# Patient Record
Sex: Male | Born: 1975 | Race: White | Hispanic: No | Marital: Married | Smoking: Current every day smoker
Health system: Southern US, Community
[De-identification: ages and names within clinical notes are randomized; demographics above are authoritative.]

## PROBLEM LIST (undated history)

## (undated) DIAGNOSIS — M1990 Unspecified osteoarthritis, unspecified site: Secondary | ICD-10-CM

## (undated) DIAGNOSIS — M069 Rheumatoid arthritis, unspecified: Secondary | ICD-10-CM

## (undated) DIAGNOSIS — N200 Calculus of kidney: Secondary | ICD-10-CM

## (undated) DIAGNOSIS — E669 Obesity, unspecified: Secondary | ICD-10-CM

## (undated) DIAGNOSIS — K219 Gastro-esophageal reflux disease without esophagitis: Secondary | ICD-10-CM

## (undated) HISTORY — DX: Obesity, unspecified: E66.9

## (undated) HISTORY — PX: HERNIA REPAIR: SHX51

## (undated) HISTORY — PX: CHOLECYSTECTOMY: SHX55

## (undated) HISTORY — DX: Rheumatoid arthritis, unspecified: M06.9

---

## 2003-05-06 ENCOUNTER — Emergency Department (HOSPITAL_COMMUNITY): Admission: EM | Admit: 2003-05-06 | Discharge: 2003-05-06 | Payer: Self-pay | Admitting: Emergency Medicine

## 2003-05-06 ENCOUNTER — Encounter: Payer: Self-pay | Admitting: Emergency Medicine

## 2007-06-06 ENCOUNTER — Emergency Department (HOSPITAL_COMMUNITY): Admission: EM | Admit: 2007-06-06 | Discharge: 2007-06-06 | Payer: Self-pay | Admitting: Emergency Medicine

## 2012-12-02 ENCOUNTER — Emergency Department (HOSPITAL_COMMUNITY)
Admission: EM | Admit: 2012-12-02 | Discharge: 2012-12-02 | Disposition: A | Discharge status: Home / Self Care | Payer: Self-pay | Attending: Emergency Medicine | Admitting: Emergency Medicine

## 2012-12-02 ENCOUNTER — Encounter (HOSPITAL_COMMUNITY): Payer: Self-pay | Admitting: *Deleted

## 2012-12-02 ENCOUNTER — Emergency Department (HOSPITAL_COMMUNITY): Payer: Self-pay

## 2012-12-02 DIAGNOSIS — F17200 Nicotine dependence, unspecified, uncomplicated: Secondary | ICD-10-CM | POA: Insufficient documentation

## 2012-12-02 DIAGNOSIS — R6883 Chills (without fever): Secondary | ICD-10-CM | POA: Insufficient documentation

## 2012-12-02 DIAGNOSIS — J36 Peritonsillar abscess: Secondary | ICD-10-CM | POA: Insufficient documentation

## 2012-12-02 DIAGNOSIS — R112 Nausea with vomiting, unspecified: Secondary | ICD-10-CM | POA: Insufficient documentation

## 2012-12-02 DIAGNOSIS — R070 Pain in throat: Secondary | ICD-10-CM | POA: Insufficient documentation

## 2012-12-02 LAB — POCT I-STAT, CHEM 8
Calcium, Ion: 1.18 mmol/L (ref 1.12–1.23)
Creatinine, Ser: 0.8 mg/dL (ref 0.50–1.35)
Glucose, Bld: 138 mg/dL — ABNORMAL HIGH (ref 70–99)
HCT: 49 % (ref 39.0–52.0)
Hemoglobin: 16.7 g/dL (ref 13.0–17.0)
Potassium: 3.8 mEq/L (ref 3.5–5.1)
TCO2: 28 mmol/L (ref 0–100)

## 2012-12-02 LAB — RAPID STREP SCREEN (MED CTR MEBANE ONLY): Streptococcus, Group A Screen (Direct): NEGATIVE

## 2012-12-02 MED ORDER — ONDANSETRON 4 MG PO TBDP: 4.0000 mg | ORAL_TABLET | Freq: Three times a day (TID) | ORAL | Status: DC | PRN

## 2012-12-02 MED ORDER — CLINDAMYCIN HCL 150 MG PO CAPS: ORAL_CAPSULE | ORAL | Status: DC

## 2012-12-02 MED ORDER — SODIUM CHLORIDE 0.9 % IV BOLUS (SEPSIS)
500.0000 mL | Freq: Once | INTRAVENOUS | Status: AC
  Administered 2012-12-02: 500 mL via INTRAVENOUS

## 2012-12-02 MED ORDER — SODIUM CHLORIDE 0.9 % IV SOLN
INTRAVENOUS | Status: DC
  Administered 2012-12-02: 19:00:00 via INTRAVENOUS

## 2012-12-02 MED ORDER — HYDROCODONE-ACETAMINOPHEN 7.5-500 MG/15ML PO SOLN: 15.0000 mL | Freq: Four times a day (QID) | ORAL | Status: DC | PRN

## 2012-12-02 MED ORDER — HYDROMORPHONE HCL PF 1 MG/ML IJ SOLN
1.0000 mg | Freq: Once | INTRAMUSCULAR | Status: AC
  Administered 2012-12-02: 1 mg via INTRAVENOUS
  Filled 2012-12-02: qty 1

## 2012-12-02 MED ORDER — IOHEXOL 300 MG/ML  SOLN
75.0000 mL | Freq: Once | INTRAMUSCULAR | Status: AC | PRN
  Administered 2012-12-02: 75 mL via INTRAVENOUS

## 2012-12-02 MED ORDER — MORPHINE SULFATE 4 MG/ML IJ SOLN
4.0000 mg | INTRAMUSCULAR | Status: DC | PRN
  Administered 2012-12-02: 4 mg via INTRAVENOUS
  Filled 2012-12-02: qty 1

## 2012-12-02 MED ORDER — ONDANSETRON HCL 4 MG/2ML IJ SOLN
4.0000 mg | INTRAMUSCULAR | Status: DC | PRN
  Administered 2012-12-02: 4 mg via INTRAVENOUS
  Filled 2012-12-02: qty 2

## 2012-12-02 MED ORDER — CLINDAMYCIN PHOSPHATE 600 MG/50ML IV SOLN
600.0000 mg | Freq: Once | INTRAVENOUS | Status: AC
  Administered 2012-12-02: 600 mg via INTRAVENOUS
  Filled 2012-12-02: qty 50

## 2012-12-02 MED ORDER — DEXAMETHASONE SODIUM PHOSPHATE 4 MG/ML IJ SOLN
10.0000 mg | Freq: Once | INTRAMUSCULAR | Status: AC
  Administered 2012-12-02: 10 mg via INTRAVENOUS
  Filled 2012-12-02: qty 3

## 2012-12-02 MED ORDER — SODIUM CHLORIDE 0.9 % IV BOLUS (SEPSIS)
1000.0000 mL | Freq: Once | INTRAVENOUS | Status: AC
  Administered 2012-12-02: 1000 mL via INTRAVENOUS

## 2012-12-02 NOTE — ED Notes (Signed)
Tolerating po fluids - drank approx 4 oz of Sprite.  Pt does c/o pain with swallowing.

## 2012-12-02 NOTE — ED Provider Notes (Signed)
History     CSN: [id number]  Arrival date & time 12/02/12  1551   First MD Initiated Contact with Patient 12/02/12 1610      Chief Complaint  Patient presents with  . Sore Throat     HPI Pt was seen at 1625.   Per pt, c/o gradual onset and persistence of constant sore throat and "chills" for the past 5 days.  Has been associated with several episodes of N/V for the past 2 days. States the right side of his throat "hurts more" than the left side. Denies fevers, no rash, no CP/SOB, no diarrhea, no abd pain.     History reviewed. No pertinent past medical history.  Past Surgical History  Procedure Laterality Date  . Hernia repair       History  Substance Use Topics  . Smoking status: Current Every Day Smoker  . Smokeless tobacco: Not on file  . Alcohol Use: No      Review of Systems ROS: Statement: All systems negative except as marked or noted in the HPI; Constitutional: Negative for fever and +chills. ; ; Eyes: Negative for eye pain, redness and discharge. ; ; ENMT: Negative for ear pain, hoarseness, nasal congestion, sinus pressure and +sore throat. ; ; Cardiovascular: Negative for chest pain, palpitations, diaphoresis, dyspnea and peripheral edema. ; ; Respiratory: Negative for cough, wheezing and stridor. ; ; Gastrointestinal: +N/V. Negative for diarrhea, abdominal pain, blood in stool, hematemesis, jaundice and rectal bleeding. . ; ; Genitourinary: Negative for dysuria, flank pain and hematuria. ; ; Musculoskeletal: Negative for back pain and neck pain. Negative for swelling and trauma.; ; Skin: Negative for pruritus, rash, abrasions, blisters, bruising and skin lesion.; ; Neuro: Negative for headache, lightheadedness and neck stiffness. Negative for weakness, altered level of consciousness , altered mental status, extremity weakness, paresthesias, involuntary movement, seizure and syncope.       Allergies  Review of patient's allergies indicates no known  allergies.  Home Medications   Current Outpatient Rx  Name  Route  Sig  Dispense  Refill  . acetaminophen (TYLENOL) 500 MG tablet   Oral   Take 500 mg by mouth every 6 (six) hours as needed for pain.         Marland Kitchen Phenylephrine-DM-GG-APAP (TYLENOL COLD/FLU SEVERE) 5-10-200-325 MG TABS   Oral   Take 2 tablets by mouth daily as needed (for pain and cold symptoms).           BP 106/82  Pulse 84  Temp(Src) 98.8 F (37.1 C) (Oral)  Resp 18  Ht 5\' 8"  (1.727 m)  Wt 230 lb (104.327 kg)  BMI 34.98 kg/m2  SpO2 95%  Physical Exam 1630: Physical examination:  Nursing notes reviewed; Vital signs and O2 SAT reviewed;  Constitutional: Well developed, Well nourished, Well hydrated, In no acute distress; Head:  Normocephalic, atraumatic; Eyes: EOMI, PERRL, No scleral icterus; ENMT: TM's clear bilat. +edemetous nasal turbinates bilat with clear rhinorrhea. Mouth and pharynx without lesions. +posterior pharyngeal erythema with right soft palate bulging. No tonsillar exudates. No submandibular or sublingual edema. No hoarse voice, no drooling, no stridor. No pain with manipulation of larynx. Mucous membranes moist; Neck: Supple, Full range of motion, No lymphadenopathy; Cardiovascular: Regular rate and rhythm, No murmur, rub, or gallop; Respiratory: Breath sounds clear & equal bilaterally, No rales, rhonchi, wheezes.  Speaking full sentences with ease, Normal respiratory effort/excursion; Chest: Nontender, Movement normal; Abdomen: Soft, Nontender, Nondistended, Normal bowel sounds;; Extremities: Pulses normal, No tenderness, No edema, No  calf edema or asymmetry.; Neuro: AA&Ox3, Major CN grossly intact.  Speech clear. No gross focal motor or sensory deficits in extremities.; Skin: Color normal, Warm, Dry.   ED Course  Procedures     MDM  MDM Reviewed: previous chart, nursing note and vitals Interpretation: labs and CT scan   Results for orders placed during the hospital encounter of 12/02/12   RAPID STREP SCREEN      Result Value Range   Streptococcus, Group A Screen (Direct) NEGATIVE  NEGATIVE  POCT I-STAT, CHEM 8      Result Value Range   Sodium 137  135 - 145 mEq/L   Potassium 3.8  3.5 - 5.1 mEq/L   Chloride 101  96 - 112 mEq/L   BUN 22  6 - 23 mg/dL   Creatinine, Ser 1.61  0.50 - 1.35 mg/dL   Glucose, Bld 096 (*) 70 - 99 mg/dL   Calcium, Ion 0.45  4.09 - 1.23 mmol/L   TCO2 28  0 - 100 mmol/L   Hemoglobin 16.7  13.0 - 17.0 g/dL   HCT 81.1  91.4 - 78.2 %   Ct Soft Tissue Neck W Contrast 12/02/2012  *RADIOLOGY REPORT*  Clinical Data: Sore throat, pain  CT NECK WITH CONTRAST  Technique:  Multidetector CT imaging of the neck was performed with intravenous contrast.  Contrast: 75mL OMNIPAQUE IOHEXOL 300 MG/ML  SOLN  Comparison: None.  Findings: 1.8 x 2.5 x 2.1 cm right peritonsillar abscess (series 2/image 32).  The airway is narrowed but remains patent.  Associated bilateral cervical lymph nodes measuring up to 12 mm short axis on the right and 9 mm short axis on the left, likely reactive.  Mucosal thickening/partial opacification of the left maxillary sinus.  The mastoid air cells are clear.  Thyroid is within normal limits.  The cervical spine is normal.  The visualized posterior fossa is unremarkable.  IMPRESSION: 2.5 cm right peritonsillar abscess.   Original Report Authenticated By: Charline Bills, M.D.      Kinsey.Precise:  Dx and testing d/w pt.  Questions answered.  Verb understanding.  States he "doesn't want to go to [facility] by ambulance" and would rather see an ENT MD in the office.  T/C to ENT Dr Annalee Genta, case discussed, including:  HPI, pertinent PM/SHx, VS/PE, dx testing, ED course and treatment:  requests to dose IV decadron and clindamycin here in the ED, make sure pt is well hydrated with 2L NS IV, and have pt call the office tomorrow morning at 9am to be seen in the office tomorrow.  Pt informed of conversation and is agreeable with plan.  States he is hungry and wants to eat.   Will trial PO.  2010:  Pt has tol PO well while in the ED without N/V or gagging.  Pt states he feels "a little better" and wants to go home now.  Pt agrees he will f/u with ENT MD tomorrow in his office.  Will tx pain meds, antiemetic and abx.            Laray Anger, DO 12/04/12 2107

## 2012-12-02 NOTE — ED Notes (Signed)
Pt given Sprite 8oz for po fluid challenge.

## 2012-12-02 NOTE — ED Notes (Addendum)
Pt c/o sore throat since Thursday, ?fever, n/v for the past two days,

## 2014-05-19 ENCOUNTER — Encounter (HOSPITAL_COMMUNITY): Payer: Self-pay | Admitting: Emergency Medicine

## 2014-05-19 ENCOUNTER — Emergency Department (HOSPITAL_COMMUNITY)
Admission: EM | Admit: 2014-05-19 | Discharge: 2014-05-20 | Disposition: A | Discharge status: Home / Self Care | Payer: Worker's Compensation | Attending: Emergency Medicine | Admitting: Emergency Medicine

## 2014-05-19 ENCOUNTER — Emergency Department (HOSPITAL_COMMUNITY): Payer: Worker's Compensation

## 2014-05-19 DIAGNOSIS — S0180XA Unspecified open wound of other part of head, initial encounter: Secondary | ICD-10-CM | POA: Insufficient documentation

## 2014-05-19 DIAGNOSIS — Y9289 Other specified places as the place of occurrence of the external cause: Secondary | ICD-10-CM | POA: Insufficient documentation

## 2014-05-19 DIAGNOSIS — F17200 Nicotine dependence, unspecified, uncomplicated: Secondary | ICD-10-CM | POA: Diagnosis not present

## 2014-05-19 DIAGNOSIS — Y9389 Activity, other specified: Secondary | ICD-10-CM | POA: Diagnosis not present

## 2014-05-19 DIAGNOSIS — S0230XB Fracture of orbital floor, unspecified side, initial encounter for open fracture: Secondary | ICD-10-CM | POA: Diagnosis not present

## 2014-05-19 DIAGNOSIS — IMO0002 Reserved for concepts with insufficient information to code with codable children: Secondary | ICD-10-CM | POA: Insufficient documentation

## 2014-05-19 DIAGNOSIS — Z23 Encounter for immunization: Secondary | ICD-10-CM | POA: Insufficient documentation

## 2014-05-19 DIAGNOSIS — S0990XA Unspecified injury of head, initial encounter: Secondary | ICD-10-CM | POA: Diagnosis present

## 2014-05-19 DIAGNOSIS — S0285XB Fracture of orbit, unspecified, initial encounter for open fracture: Secondary | ICD-10-CM

## 2014-05-19 MED ORDER — CEFAZOLIN SODIUM 1-5 GM-% IV SOLN
1.0000 g | Freq: Once | INTRAVENOUS | Status: AC
  Administered 2014-05-19: 1 g via INTRAVENOUS
  Filled 2014-05-19: qty 50

## 2014-05-19 MED ORDER — LIDOCAINE-EPINEPHRINE 1 %-1:100000 IJ SOLN
20.0000 mL | Freq: Once | INTRAMUSCULAR | Status: DC
  Filled 2014-05-19: qty 1

## 2014-05-19 MED ORDER — TETANUS-DIPHTHERIA TOXOIDS TD 5-2 LFU IM INJ
0.5000 mL | INJECTION | Freq: Once | INTRAMUSCULAR | Status: AC
  Administered 2014-05-19: 0.5 mL via INTRAMUSCULAR
  Filled 2014-05-19: qty 0.5

## 2014-05-19 MED ORDER — LIDOCAINE-EPINEPHRINE 2 %-1:100000 IJ SOLN: 20.0000 mL | Freq: Once | INTRAMUSCULAR | Status: DC

## 2014-05-19 NOTE — ED Notes (Addendum)
Per ems: working on [employer] parts and was struck in head with airbrake chamber and the caliper spring released and struck patient in head, no loc, but momentary disorientation.  GCS 15 throughout ems ride.  Spine and back cleared at scene, no pain in either of those areas. Bleeding controlled, laceration to right brow, 2 cm.

## 2014-05-19 NOTE — Progress Notes (Signed)
Orthopedic Tech Progress Note Patient Details:  BAIRD POLINSKI [birth year] [id number]  Patient ID: Petra Kuba, male   DOB: 21-Aug-1976, 38 y.o.   MRN: [id number] Made level 2 trauma visit  Hildred Priest 05/19/2014, 10:06 PM

## 2014-05-20 MED ORDER — CEPHALEXIN 500 MG PO CAPS: 500.0000 mg | ORAL_CAPSULE | Freq: Four times a day (QID) | ORAL | Status: DC

## 2014-05-20 MED ORDER — OXYCODONE-ACETAMINOPHEN 5-325 MG PO TABS
2.0000 | ORAL_TABLET | Freq: Once | ORAL | Status: AC
  Administered 2014-05-20: 2 via ORAL
  Filled 2014-05-20: qty 2

## 2014-05-20 MED ORDER — ONDANSETRON HCL 4 MG/2ML IJ SOLN
4.0000 mg | Freq: Once | INTRAMUSCULAR | Status: AC
  Administered 2014-05-20: 4 mg via INTRAVENOUS
  Filled 2014-05-20: qty 2

## 2014-05-20 MED ORDER — OXYCODONE-ACETAMINOPHEN 5-325 MG PO TABS: 2.0000 | ORAL_TABLET | ORAL | Status: DC | PRN

## 2014-05-20 NOTE — ED Provider Notes (Signed)
CSN: [id number]     Arrival date & time 05/19/14  2159 History   First MD Initiated Contact with Patient 05/19/14 2201     Chief Complaint  Patient presents with  . Head Injury     HPI  Patient presents after an injury at work. He works on [employer]. He was working on a hydraulic break. There is a spring device that is under 3000 pound PSI pressure. A fragment broke off of this. It struck him either directly or irritation a fracture. He has pain of his right eye the laceration. He states that it took him a few seconds or less what was going on but he had no loss of consciousness. No additional injury.  History reviewed. No pertinent past medical history. Past Surgical History  Procedure Laterality Date  . Hernia repair     No family history on file. History  Substance Use Topics  . Smoking status: Current Every Day Smoker -- 0.00 packs/day  . Smokeless tobacco: Not on file  . Alcohol Use: No    Review of Systems  Constitutional: Negative for fever, chills, diaphoresis, appetite change and fatigue.  HENT: Negative for mouth sores, sore throat and trouble swallowing.   Eyes: Negative for visual disturbance.  Respiratory: Negative for cough, chest tightness, shortness of breath and wheezing.   Cardiovascular: Negative for chest pain.  Gastrointestinal: Negative for nausea, vomiting, abdominal pain, diarrhea and abdominal distention.  Endocrine: Negative for polydipsia, polyphagia and polyuria.  Genitourinary: Negative for dysuria, frequency and hematuria.  Musculoskeletal: Negative for gait problem.  Skin: Positive for wound. Negative for color change, pallor and rash.  Neurological: Positive for headaches. Negative for dizziness, syncope and light-headedness.  Hematological: Does not bruise/bleed easily.  Psychiatric/Behavioral: Negative for behavioral problems and confusion.      Allergies  Morphine and related  Home Medications   Prior to Admission medications     Medication Sig Start Date End Date Taking? Authorizing Provider  diphenhydrAMINE (BENADRYL) 25 MG tablet Take 25 mg by mouth every 6 (six) hours as needed for itching or allergies.    Yes Historical Provider, MD  cephALEXin (KEFLEX) 500 MG capsule Take 1 capsule (500 mg total) by mouth 4 (four) times daily. 05/20/14   Tanna Furry, MD  oxyCODONE-acetaminophen (PERCOCET/ROXICET) 5-325 MG per tablet Take 2 tablets by mouth every 4 (four) hours as needed. 05/20/14   Tanna Furry, MD   BP 123/73  Pulse 72  Temp(Src) 98.6 F (37 C) (Oral)  Resp 18  Ht 5\' 7"  (1.702 m)  Wt 240 lb (108.863 kg)  BMI 37.58 kg/m2  SpO2 93% Physical Exam  HENT:  Head:    Eyes:      ED Course  LACERATION REPAIR Date/Time: 05/20/2014 12:27 AM Performed by: Tanna Furry Authorized by: Tanna Furry Consent: Verbal consent obtained. written consent obtained. Risks and benefits: risks, benefits and alternatives were discussed Consent given by: patient Patient understanding: patient states understanding of the procedure being performed Patient identity confirmed: verbally with patient Body area: head/neck Location details: right eyebrow Laceration length: 3 cm Foreign bodies: no foreign bodies Tendon involvement: none Nerve involvement: none Vascular damage: no Anesthesia: local infiltration Local anesthetic: lidocaine 2% with epinephrine Anesthetic total: 5 ml Irrigation solution: saline Irrigation method: syringe Amount of cleaning: standard Debridement: none Degree of undermining: none Skin closure: 5-0 nylon Subcutaneous closure: 5-0 Vicryl Fascia closure: 5-0 Vicryl Number of sutures: 20 Technique: simple and running Approximation: close Approximation difficulty: complex Dressing: 4x4 sterile gauze (Ace  wrap)   (including critical care time) Labs Review Labs Reviewed - No data to display  Imaging Review Ct Head Wo Contrast  05/19/2014   CLINICAL DATA:  Head trauma, confusion  EXAM: CT HEAD  WITHOUT CONTRAST  TECHNIQUE: Contiguous axial images were obtained from the base of the skull through the vertex without intravenous contrast.  COMPARISON:  Neck CT 12/02/2012  FINDINGS: Cavum vergae incidentally noted. No acute hemorrhage, infarct, or mass lesion is identified. No midline shift. Ventricles are normal in size. Orbits and paranasal sinuses are unremarkable. No skull fracture. Earrings produce streak artifact. Chronic left maxillary sinusitis noted. Partial opacification of the ethmoid sinuses is noted. There is a mildly displaced fracture of the superior right orbital wall with overlying scalp laceration and mild stranding within the adjacent intraconal fat. The globes are grossly unremarkable.  IMPRESSION: Mildly depressed superior right orbital wall fracture with overlying skin laceration. Mild adjacent stranding within the intraconal fat.  Chronic left maxillary sinusitis.  No acute intra cranial finding.   Electronically Signed   By: Conchita Paris M.D.   On: 05/19/2014 22:54     EKG Interpretation None      MDM   Final diagnoses:  Orbital fracture, open, initial encounter    CT scan results noted. Shows an indentation of the supraorbital ridge and a fracture of his lateral right orbital rub. This fragment is displaced posteriorly slightly inferiorly. There is some minimal adjacent orbital at the same. However no retro-orbital hematoma noted. Repeat exam his vision is normal as I moves normally. No abnormality noted to the conjunctiva the bulbar or palpebral.  Care discussed with Dr. Katy Apo, ophthalmology. He felt this no globe abnormalities or vision abnormalities wound could be closed and he was follow the patient tomorrow morning in his office.  Wound was anesthetized and copiously irrigated. Upon exploration there is visualized fracture at the supraorbital rim. Not able to be removed to the laceration. It was then closed in 3 layer fashion as above. Repeat exam shows  no conjunctival abnormalities continued normal vision and extraocular movements.    Tanna Furry, MD 05/20/14 [phone]

## 2014-05-20 NOTE — Progress Notes (Signed)
Chaplain responded to level 2 trauma page for pt in "tractor trailer accident." Chaplain visited briefly with pt after medical staff gave me the green light to do so. Pt awake and alert but with nasty laceration above his right eye. Pt states he was working on the truck (in a [employer]) when part of a spring flew off and struck him in the head. I offered to make a call for him but he had a cell phone and wanted to call his wife himself.

## 2014-05-20 NOTE — Discharge Instructions (Signed)
Recheck tomorrow morning at Dr. Prudencio Burly office at 10:00am.  Facial Fracture A facial fracture is a break in one of the bones of your face. HOME CARE INSTRUCTIONS   Protect the injured part of your face until it is healed.  Do not participate in activities which give chance for re-injury until your doctor approves.  Gently wash and dry your face.  Wear head and facial protection while riding a bicycle, motorcycle, or snowmobile. SEEK MEDICAL CARE IF:   An oral temperature above 102 F (38.9 C) develops.  You have severe headaches or notice changes in your vision.  You have new numbness or tingling in your face.  You develop nausea (feeling sick to your stomach), vomiting or a stiff neck. SEEK IMMEDIATE MEDICAL CARE IF:   You develop difficulty seeing or experience double vision.  You become dizzy, lightheaded, or faint.  You develop trouble speaking, breathing, or swallowing.  You have a watery discharge from your nose or ear. MAKE SURE YOU:   Understand these instructions.  Will watch your condition.  Will get help right away if you are not doing well or get worse. Document Released: 08/20/2005 Document Revised: 11/12/2011 Document Reviewed: 04/08/2008 [facility] Patient Information 2015 [place], [place]. This information is not intended to replace advice given to you by your health care provider. Make sure you discuss any questions you have with your health care provider.

## 2016-02-13 ENCOUNTER — Emergency Department (HOSPITAL_COMMUNITY): Payer: Self-pay

## 2016-02-13 ENCOUNTER — Encounter (HOSPITAL_COMMUNITY): Payer: Self-pay | Admitting: Emergency Medicine

## 2016-02-13 ENCOUNTER — Emergency Department (HOSPITAL_COMMUNITY)
Admission: EM | Admit: 2016-02-13 | Discharge: 2016-02-13 | Disposition: A | Discharge status: Home / Self Care | Payer: Self-pay | Attending: Emergency Medicine | Admitting: Emergency Medicine

## 2016-02-13 DIAGNOSIS — K8050 Calculus of bile duct without cholangitis or cholecystitis without obstruction: Secondary | ICD-10-CM | POA: Insufficient documentation

## 2016-02-13 DIAGNOSIS — F17210 Nicotine dependence, cigarettes, uncomplicated: Secondary | ICD-10-CM | POA: Insufficient documentation

## 2016-02-13 DIAGNOSIS — R079 Chest pain, unspecified: Secondary | ICD-10-CM | POA: Insufficient documentation

## 2016-02-13 DIAGNOSIS — R1013 Epigastric pain: Secondary | ICD-10-CM

## 2016-02-13 LAB — URINALYSIS, ROUTINE W REFLEX MICROSCOPIC
BILIRUBIN URINE: NEGATIVE
GLUCOSE, UA: NEGATIVE mg/dL
HGB URINE DIPSTICK: NEGATIVE
Ketones, ur: NEGATIVE mg/dL
Leukocytes, UA: NEGATIVE
Nitrite: NEGATIVE
Protein, ur: NEGATIVE mg/dL
SPECIFIC GRAVITY, URINE: 1.02 (ref 1.005–1.030)
pH: 7 (ref 5.0–8.0)

## 2016-02-13 LAB — BASIC METABOLIC PANEL
Anion gap: 6 (ref 5–15)
BUN: 20 mg/dL (ref 6–20)
CO2: 27 mmol/L (ref 22–32)
Calcium: 9.2 mg/dL (ref 8.9–10.3)
Chloride: 104 mmol/L (ref 101–111)
Creatinine, Ser: 0.79 mg/dL (ref 0.61–1.24)
GFR calc Af Amer: >60 mL/min (ref >60)
GFR calc non Af Amer: >60 mL/min (ref >60)
GLUCOSE: 142 mg/dL — AB (ref 65–99)
Potassium: 3.5 mmol/L (ref 3.5–5.1)
SODIUM: 137 mmol/L (ref 135–145)

## 2016-02-13 LAB — HEPATIC FUNCTION PANEL
ALBUMIN: 4.2 g/dL (ref 3.5–5.0)
ALK PHOS: 95 U/L (ref 38–126)
ALT: 23 U/L (ref 17–63)
AST: 19 U/L (ref 15–41)
BILIRUBIN TOTAL: 0.4 mg/dL (ref 0.3–1.2)
Bilirubin, Direct: <0.1 mg/dL — ABNORMAL LOW (ref 0.1–0.5)
Total Protein: 7.5 g/dL (ref 6.5–8.1)

## 2016-02-13 LAB — CBC
HEMATOCRIT: 43.6 % (ref 39.0–52.0)
HEMOGLOBIN: 14.9 g/dL (ref 13.0–17.0)
MCH: 32.3 pg (ref 26.0–34.0)
MCHC: 34.2 g/dL (ref 30.0–36.0)
MCV: 94.6 fL (ref 78.0–100.0)
PLATELETS: 297 10*3/uL (ref 150–400)
RBC: 4.61 MIL/uL (ref 4.22–5.81)
RDW: 13 % (ref 11.5–15.5)
WBC: 10.5 10*3/uL (ref 4.0–10.5)

## 2016-02-13 LAB — TROPONIN I: Troponin I: <0.03 ng/mL (ref <0.031)

## 2016-02-13 LAB — LIPASE, BLOOD: Lipase: 15 U/L (ref 11–51)

## 2016-02-13 MED ORDER — GI COCKTAIL ~~LOC~~
30.0000 mL | Freq: Once | ORAL | Status: AC
  Administered 2016-02-13: 30 mL via ORAL
  Filled 2016-02-13: qty 30

## 2016-02-13 MED ORDER — ASPIRIN 81 MG PO CHEW
324.0000 mg | CHEWABLE_TABLET | Freq: Once | ORAL | Status: AC
  Administered 2016-02-13: 324 mg via ORAL
  Filled 2016-02-13: qty 4

## 2016-02-13 MED ORDER — OMEPRAZOLE 20 MG PO CPDR: 20.0000 mg | DELAYED_RELEASE_CAPSULE | Freq: Every day | ORAL | Status: DC

## 2016-02-13 MED ORDER — FAMOTIDINE IN NACL 20-0.9 MG/50ML-% IV SOLN
20.0000 mg | Freq: Once | INTRAVENOUS | Status: AC
  Administered 2016-02-13: 20 mg via INTRAVENOUS
  Filled 2016-02-13: qty 50

## 2016-02-13 MED ORDER — FENTANYL CITRATE (PF) 100 MCG/2ML IJ SOLN
50.0000 ug | INTRAMUSCULAR | Status: DC | PRN
  Administered 2016-02-13 (×2): 50 ug via INTRAVENOUS
  Filled 2016-02-13 (×2): qty 2

## 2016-02-13 MED ORDER — ASPIRIN 81 MG PO CHEW
CHEWABLE_TABLET | ORAL | Status: AC
  Filled 2016-02-13: qty 1

## 2016-02-13 NOTE — ED Provider Notes (Signed)
  Physical Exam  BP 132/81 mmHg  Pulse 43  Temp(Src) 97.7 F (36.5 C) (Oral)  Resp 20  Ht 5\' 7"  (1.702 m)  Wt 240 lb (108.863 kg)  BMI 37.58 kg/m2  SpO2 97%  Physical Exam  ED Course  Procedures  MDM Ultrasound showed gallstones. May have slight gallbladder wall thickening. Nontender on my examination. Likely biliary colic without cholecystitis. Discharge home with surgery follow-up.      Davonna Belling, MD 02/13/16 1711

## 2016-02-13 NOTE — Discharge Instructions (Signed)
Cholelithiasis Cholelithiasis (also called gallstones) is a form of gallbladder disease in which gallstones form in your gallbladder. The gallbladder is an organ that stores bile made in the liver, which helps digest fats. Gallstones begin as small crystals and slowly grow into stones. Gallstone pain occurs when the gallbladder spasms and a gallstone is blocking the duct. Pain can also occur when a stone passes out of the duct.  RISK FACTORS  Being male.   Having multiple pregnancies. Health care providers sometimes advise removing diseased gallbladders before future pregnancies.   Being obese.  Eating a diet heavy in fried foods and fat.   Being older than 36 years and increasing age.   Prolonged use of medicines containing male hormones.   Having diabetes mellitus.   Rapidly losing weight.   Having a family history of gallstones (heredity).  SYMPTOMS  Nausea.   Vomiting.  Abdominal pain.   Yellowing of the skin (jaundice).   Sudden pain. It may persist from several minutes to several hours.  Fever.   Tenderness to the touch. In some cases, when gallstones do not move into the bile duct, people have no pain or symptoms. These are called "silent" gallstones.  TREATMENT Silent gallstones do not need treatment. In severe cases, emergency surgery may be required. Options for treatment include:  Surgery to remove the gallbladder. This is the most common treatment.  Medicines. These do not always work and may take 6-12 months or more to work.  Shock wave treatment (extracorporeal biliary lithotripsy). In this treatment an ultrasound machine sends shock waves to the gallbladder to break gallstones into smaller pieces that can pass into the intestines or be dissolved by medicine. HOME CARE INSTRUCTIONS   Only take over-the-counter or prescription medicines for pain, discomfort, or fever as directed by your health care provider.   Follow a low-fat diet  until seen again by your health care provider. Fat causes the gallbladder to contract, which can result in pain.   Follow up with your health care provider as directed. Attacks are almost always recurrent and surgery is usually required for permanent treatment.  SEEK IMMEDIATE MEDICAL CARE IF:   Your pain increases and is not controlled by medicines.   You have a fever or persistent symptoms for more than 2-3 days.   You have a fever and your symptoms suddenly get worse.   You have persistent nausea and vomiting.  MAKE SURE YOU:   Understand these instructions.  Will watch your condition.  Will get help right away if you are not doing well or get worse.   This information is not intended to replace advice given to you by your health care provider. Make sure you discuss any questions you have with your health care provider.   Document Released: 08/16/2005 Document Revised: 04/22/2013 Document Reviewed: 02/11/2013 Elsevier Interactive Patient Education [employer].  you have gallstones avoid fatty foods. Take acid suppression medicines until you follow up with a physician.  If you were given medicines take as directed.  If you are on coumadin or contraceptives realize their levels and effectiveness is altered by many different medicines.  If you have any reaction (rash, tongues swelling, other) to the medicines stop taking and see a physician.    If your blood pressure was elevated in the ER make sure you follow up for management with a primary doctor or return for chest pain, shortness of breath or stroke symptoms.  Please follow up as directed and return to the  ER or see a physician for new or worsening symptoms.  Thank you. Filed Vitals:   02/13/16 1130 02/13/16 1200 02/13/16 1230 02/13/16 1503  BP: 152/83 145/97 120/93 158/71  Pulse: 56  63 56  Temp:      TempSrc:      Resp: 16 15 18 20   Height:      Weight:      SpO2: 97%  98% 95%

## 2016-02-13 NOTE — ED Notes (Signed)
Patient ambulatory to restroom with steady gait, clean catch instructions given and advised pt to bring specimen back to room as well.  

## 2016-02-13 NOTE — ED Notes (Signed)
US at the bedside

## 2016-02-13 NOTE — ED Provider Notes (Signed)
CSN: [id number]     Arrival date & time 02/13/16  1054 History   First MD Initiated Contact with Patient 02/13/16 1114     Chief Complaint  Patient presents with  . Chest Pain     (Consider location/radiation/quality/duration/timing/severity/associated sxs/prior Treatment) HPI Comments: 40 year old male presents with epigastric discomfort with mild radiation into the chest since this morning. This is similar to his previous reflux however more persistent. No cardiac or blood clot history. No recent surgeries or leg swelling. No radiation to the back. No known gallbladder pathology. Patient's mother had her gallbladder out in the past. Decreased appetite. No recent exertional symptoms.  Patient is a 40 y.o. male presenting with chest pain. The history is provided by the patient.  Chest Pain Associated symptoms: no abdominal pain, no back pain, no fever, no headache, no shortness of breath and not vomiting     History reviewed. No pertinent past medical history. Past Surgical History  Procedure Laterality Date  . Hernia repair     History reviewed. No pertinent family history. Social History  Substance Use Topics  . Smoking status: Current Every Day Smoker -- 2.00 packs/day    Types: Cigarettes  . Smokeless tobacco: None  . Alcohol Use: No    Review of Systems  Constitutional: Positive for appetite change. Negative for fever and chills.  HENT: Negative for congestion.   Eyes: Negative for visual disturbance.  Respiratory: Negative for shortness of breath.   Cardiovascular: Positive for chest pain.  Gastrointestinal: Negative for vomiting and abdominal pain.  Genitourinary: Negative for dysuria and flank pain.  Musculoskeletal: Negative for back pain, neck pain and neck stiffness.  Skin: Negative for rash.  Neurological: Negative for light-headedness and headaches.      Allergies  Morphine and related  Home Medications   Prior to Admission medications   Medication Sig  Start Date End Date Taking? Authorizing Provider  omeprazole (PRILOSEC) 20 MG capsule Take 1 capsule (20 mg total) by mouth daily. 02/13/16   Elnora Morrison, MD   BP 158/71 mmHg  Pulse 56  Temp(Src) 97.7 F (36.5 C) (Oral)  Resp 20  Ht 5\' 7"  (1.702 m)  Wt 240 lb (108.863 kg)  BMI 37.58 kg/m2  SpO2 95% Physical Exam  Constitutional: He is oriented to person, place, and time. He appears well-developed and well-nourished.  HENT:  Head: Normocephalic and atraumatic.  Eyes: Conjunctivae are normal. Right eye exhibits no discharge. Left eye exhibits no discharge.  Neck: Normal range of motion. Neck supple. No tracheal deviation present.  Cardiovascular: Normal rate and regular rhythm.   Pulmonary/Chest: Effort normal and breath sounds normal.  Abdominal: Soft. He exhibits no distension. There is tenderness (mild epigastric). There is no guarding.  Musculoskeletal: He exhibits no edema.  Neurological: He is alert and oriented to person, place, and time.  Skin: Skin is warm. No rash noted.  Psychiatric: He has a normal mood and affect.  Nursing note and vitals reviewed.   ED Course  Procedures (including critical care time) Labs Review Labs Reviewed  BASIC METABOLIC PANEL - Abnormal; Notable for the following:    Glucose, Bld 142 (*)    All other components within normal limits  HEPATIC FUNCTION PANEL - Abnormal; Notable for the following:    Bilirubin, Direct <0.1 (*)    All other components within normal limits  URINALYSIS, ROUTINE W REFLEX MICROSCOPIC (NOT AT [facility]) - Abnormal; Notable for the following:    APPearance HAZY (*)    All other  components within normal limits  CBC  TROPONIN I  LIPASE, BLOOD  TROPONIN I    Imaging Review No results found. I have personally reviewed and evaluated these images and lab results as part of my medical decision-making.   EKG Interpretation   Date/Time:  Monday February 13 2016 11:00:24 EDT Ventricular Rate:  68 PR Interval:  168 QRS  Duration: 106 QT Interval:  426 QTC Calculation: 453 R Axis:   67 Text Interpretation:  Sinus rhythm Nonspecific ST and T wave abnormality  Confirmed by Amori Cooperman MD, Hayly Litsey [phone]) on 02/13/2016 12:02:36 PM      MDM   Final diagnoses:  Epigastric abdominal pain   Patient presents with atypical chest pain mostly epigastric discomfort. Discuss concern for reflux/ulcer/gallstones. Patient improved with GI cocktail in the ER however symptoms are to worsen. Bedside ultrasound showed possible gallstone however unable to get full details. Formal ultrasound ordered. Delta troponin negative patient low risk. Discussed outpatient follow-up with patient depending on ultrasound results. Patient comfortable this plan.  Results and differential diagnosis were discussed with the patient/parent/guardian. Xrays were independently reviewed by myself.  Close follow up outpatient was discussed, comfortable with the plan.   Medications  fentaNYL (SUBLIMAZE) injection 50 mcg (50 mcg Intravenous Given 02/13/16 1510)  aspirin chewable tablet 324 mg (324 mg Oral Given 02/13/16 1243)  famotidine (PEPCID) IVPB 20 mg premix (0 mg Intravenous Stopped 02/13/16 1314)  gi cocktail (Maalox,Lidocaine,Donnatal) (30 mLs Oral Given 02/13/16 1245)    Filed Vitals:   02/13/16 1130 02/13/16 1200 02/13/16 1230 02/13/16 1503  BP: 152/83 145/97 120/93 158/71  Pulse: 56  63 56  Temp:      TempSrc:      Resp: 16 15 18 20   Height:      Weight:      SpO2: 97%  98% 95%    Final diagnoses:  Epigastric abdominal pain       Elnora Morrison, MD 02/13/16 1553

## 2016-02-13 NOTE — ED Notes (Signed)
Pt states he was awoken from his sleep this morning with chest pain.  States he has taken multiple things for indigestion with no relief.  States he has also been sweating.

## 2016-02-13 NOTE — ED Notes (Signed)
Patient given discharge instruction, verbalized understand. IV removed, band aid applied. Patient ambulatory out of the department.  

## 2016-02-13 NOTE — ED Notes (Signed)
MD at the bedside with US

## 2016-03-17 ENCOUNTER — Emergency Department (HOSPITAL_COMMUNITY): Payer: Self-pay

## 2016-03-17 ENCOUNTER — Encounter (HOSPITAL_COMMUNITY): Payer: Self-pay | Admitting: Emergency Medicine

## 2016-03-17 ENCOUNTER — Emergency Department (HOSPITAL_COMMUNITY)
Admission: EM | Admit: 2016-03-17 | Discharge: 2016-03-17 | Disposition: A | Discharge status: Home / Self Care | Payer: Self-pay | Attending: Emergency Medicine | Admitting: Emergency Medicine

## 2016-03-17 DIAGNOSIS — F17210 Nicotine dependence, cigarettes, uncomplicated: Secondary | ICD-10-CM | POA: Insufficient documentation

## 2016-03-17 DIAGNOSIS — K8050 Calculus of bile duct without cholangitis or cholecystitis without obstruction: Secondary | ICD-10-CM | POA: Insufficient documentation

## 2016-03-17 LAB — CBC WITH DIFFERENTIAL/PLATELET
BASOS ABS: 0 10*3/uL (ref 0.0–0.1)
BASOS PCT: 0 %
EOS PCT: 3 %
Eosinophils Absolute: 0.3 10*3/uL (ref 0.0–0.7)
HEMATOCRIT: 41.9 % (ref 39.0–52.0)
HEMOGLOBIN: 14.6 g/dL (ref 13.0–17.0)
LYMPHS PCT: 22 %
Lymphs Abs: 2 10*3/uL (ref 0.7–4.0)
MCH: 32.8 pg (ref 26.0–34.0)
MCHC: 34.8 g/dL (ref 30.0–36.0)
MCV: 94.2 fL (ref 78.0–100.0)
Monocytes Absolute: 1.2 10*3/uL — ABNORMAL HIGH (ref 0.1–1.0)
Monocytes Relative: 13 %
NEUTROS ABS: 5.8 10*3/uL (ref 1.7–7.7)
NEUTROS PCT: 62 %
Platelets: 298 10*3/uL (ref 150–400)
RBC: 4.45 MIL/uL (ref 4.22–5.81)
RDW: 12.9 % (ref 11.5–15.5)
WBC: 9.4 10*3/uL (ref 4.0–10.5)

## 2016-03-17 LAB — COMPREHENSIVE METABOLIC PANEL
ALBUMIN: 4 g/dL (ref 3.5–5.0)
ALT: 18 U/L (ref 17–63)
ANION GAP: 7 (ref 5–15)
AST: 18 U/L (ref 15–41)
Alkaline Phosphatase: 115 U/L (ref 38–126)
BUN: 18 mg/dL (ref 6–20)
CHLORIDE: 105 mmol/L (ref 101–111)
CO2: 26 mmol/L (ref 22–32)
Calcium: 8.6 mg/dL — ABNORMAL LOW (ref 8.9–10.3)
Creatinine, Ser: 0.76 mg/dL (ref 0.61–1.24)
GFR calc non Af Amer: >60 mL/min (ref >60)
GLUCOSE: 129 mg/dL — AB (ref 65–99)
POTASSIUM: 3.2 mmol/L — AB (ref 3.5–5.1)
SODIUM: 138 mmol/L (ref 135–145)
Total Bilirubin: 0.3 mg/dL (ref 0.3–1.2)
Total Protein: 7.5 g/dL (ref 6.5–8.1)

## 2016-03-17 LAB — TROPONIN I

## 2016-03-17 LAB — LIPASE, BLOOD: Lipase: 17 U/L (ref 11–51)

## 2016-03-17 MED ORDER — SODIUM CHLORIDE 0.9 % IV SOLN
INTRAVENOUS | Status: DC
  Administered 2016-03-17: 08:00:00 via INTRAVENOUS

## 2016-03-17 MED ORDER — HYDROCODONE-ACETAMINOPHEN 5-325 MG PO TABS: ORAL_TABLET | ORAL | Status: DC

## 2016-03-17 MED ORDER — ONDANSETRON HCL 4 MG PO TABS: 4.0000 mg | ORAL_TABLET | Freq: Three times a day (TID) | ORAL | Status: DC | PRN

## 2016-03-17 MED ORDER — FAMOTIDINE IN NACL 20-0.9 MG/50ML-% IV SOLN
20.0000 mg | Freq: Once | INTRAVENOUS | Status: AC
  Administered 2016-03-17: 20 mg via INTRAVENOUS
  Filled 2016-03-17: qty 50

## 2016-03-17 MED ORDER — ONDANSETRON HCL 4 MG/2ML IJ SOLN
4.0000 mg | INTRAMUSCULAR | Status: AC | PRN
  Administered 2016-03-17 (×2): 4 mg via INTRAVENOUS
  Filled 2016-03-17 (×2): qty 2

## 2016-03-17 MED ORDER — HYDROMORPHONE HCL 1 MG/ML IJ SOLN
1.0000 mg | Freq: Once | INTRAMUSCULAR | Status: AC
  Administered 2016-03-17: 1 mg via INTRAVENOUS
  Filled 2016-03-17: qty 1

## 2016-03-17 MED ORDER — FENTANYL CITRATE (PF) 100 MCG/2ML IJ SOLN
50.0000 ug | INTRAMUSCULAR | Status: DC | PRN
  Administered 2016-03-17: 50 ug via INTRAVENOUS
  Filled 2016-03-17: qty 2

## 2016-03-17 NOTE — ED Provider Notes (Signed)
CSN: [id number]     Arrival date & time 03/17/16  0756 History   First MD Initiated Contact with Patient 03/17/16 [phone]     Chief Complaint  Patient presents with  . Chest Pain  . Abdominal Pain      HPI Pt was seen at 0810.  Per pt, c/o gradual onset and persistence of constant upper abd "pain" that woke him up this morning. Has been associated with multiple intermittent episodes of N/V.  Describes the abd pain as "burning." Last meal was last night. Pt states he was evaluated in the ED 1 month ago for same symptoms, dx gallstones.  Denies diarrhea, no fevers, no back pain, no rash, no CP/SOB, no black or blood in stools or emesis.       History reviewed. No pertinent past medical history.   Past Surgical History  Procedure Laterality Date  . Hernia repair      Social History  Substance Use Topics  . Smoking status: Current Every Day Smoker -- 2.00 packs/day    Types: Cigarettes  . Smokeless tobacco: Never Used  . Alcohol Use: No    Review of Systems ROS: Statement: All systems negative except as marked or noted in the HPI; Constitutional: Negative for fever and chills. ; ; Eyes: Negative for eye pain, redness and discharge. ; ; ENMT: Negative for ear pain, hoarseness, nasal congestion, sinus pressure and sore throat. ; ; Cardiovascular: Negative for chest pain, palpitations, diaphoresis, dyspnea and peripheral edema. ; ; Respiratory: Negative for cough, wheezing and stridor. ; ; Gastrointestinal: +N/V, abd pain. Negative for diarrhea, blood in stool, hematemesis, jaundice and rectal bleeding. . ; ; Genitourinary: Negative for dysuria, flank pain and hematuria. ; ; Musculoskeletal: Negative for back pain and neck pain. Negative for swelling and trauma.; ; Skin: Negative for pruritus, rash, abrasions, blisters, bruising and skin lesion.; ; Neuro: Negative for headache, lightheadedness and neck stiffness. Negative for weakness, altered level of consciousness, altered mental status,  extremity weakness, paresthesias, involuntary movement, seizure and syncope.      Allergies  Morphine and related  Home Medications   Prior to Admission medications   Medication Sig Start Date End Date Taking? Authorizing Provider  omeprazole (PRILOSEC) 20 MG capsule Take 1 capsule (20 mg total) by mouth daily. 02/13/16   Elnora Morrison, MD   BP 147/98 mmHg  Pulse 59  Temp(Src) 97.7 F (36.5 C) (Oral)  Resp 14  Ht 5\' 7"  (1.702 m)  Wt 220 lb (99.791 kg)  BMI 34.45 kg/m2  SpO2 97% Physical Exam  0815: Physical examination:  Nursing notes reviewed; Vital signs and O2 SAT reviewed;  Constitutional: Well developed, Well nourished, Well hydrated, In no acute distress; Head:  Normocephalic, atraumatic; Eyes: EOMI, PERRL, No scleral icterus; ENMT: Mouth and pharynx normal, Mucous membranes moist; Neck: Supple, Full range of motion, No lymphadenopathy; Cardiovascular: Regular rate and rhythm, No murmur, rub, or gallop; Respiratory: Breath sounds clear & equal bilaterally, No rales, rhonchi, wheezes.  Speaking full sentences with ease, Normal respiratory effort/excursion; Chest: Nontender, Movement normal; Abdomen: Soft, +mid-epigastric tenderness to palp. No rebound or guarding. Nondistended, Normal bowel sounds; Genitourinary: No CVA tenderness; Extremities: Pulses normal, No tenderness, No edema, No calf edema or asymmetry.; Neuro: AA&Ox3, Major CN grossly intact.  Speech clear. No gross focal motor or sensory deficits in extremities.; Skin: Color normal, Warm, Dry.   ED Course  Procedures (including critical care time) Labs Review  Imaging Review  I have personally reviewed and evaluated these  images and lab results as part of my medical decision-making.   EKG Interpretation   Date/Time:  Saturday March 17 2016 08:02:48 EDT Ventricular Rate:  60 PR Interval:    QRS Duration: 111 QT Interval:  441 QTC Calculation: 441 R Axis:   68 Text Interpretation:  Sinus rhythm Abnormal lateral  Q waves When compared  with ECG of 02/13/2016 No significant change was found Confirmed by [facility]   MD, Nunzio Cory [phone]) on 03/17/2016 8:17:49 AM      MDM  MDM Reviewed: previous chart, nursing note and vitals Reviewed previous: labs, ultrasound, x-ray and ECG Interpretation: labs, ECG, x-ray and ultrasound     Results for orders placed or performed during the hospital encounter of 03/17/16  Comprehensive metabolic panel  Result Value Ref Range   Sodium 138 135 - 145 mmol/L   Potassium 3.2 (L) 3.5 - 5.1 mmol/L   Chloride 105 101 - 111 mmol/L   CO2 26 22 - 32 mmol/L   Glucose, Bld 129 (H) 65 - 99 mg/dL   BUN 18 6 - 20 mg/dL   Creatinine, Ser 0.76 0.61 - 1.24 mg/dL   Calcium 8.6 (L) 8.9 - 10.3 mg/dL   Total Protein 7.5 6.5 - 8.1 g/dL   Albumin 4.0 3.5 - 5.0 g/dL   AST 18 15 - 41 U/L   ALT 18 17 - 63 U/L   Alkaline Phosphatase 115 38 - 126 U/L   Total Bilirubin 0.3 0.3 - 1.2 mg/dL   GFR calc non Af Amer >60 >60 mL/min   GFR calc Af Amer >60 >60 mL/min   Anion gap 7 5 - 15  Lipase, blood  Result Value Ref Range   Lipase 17 11 - 51 U/L  Troponin I  Result Value Ref Range   Troponin I <0.03 <0.03 ng/mL  CBC with Differential  Result Value Ref Range   WBC 9.4 4.0 - 10.5 K/uL   RBC 4.45 4.22 - 5.81 MIL/uL   Hemoglobin 14.6 13.0 - 17.0 g/dL   HCT 41.9 39.0 - 52.0 %   MCV 94.2 78.0 - 100.0 fL   MCH 32.8 26.0 - 34.0 pg   MCHC 34.8 30.0 - 36.0 g/dL   RDW 12.9 11.5 - 15.5 %   Platelets 298 150 - 400 K/uL   Neutrophils Relative % 62 %   Neutro Abs 5.8 1.7 - 7.7 K/uL   Lymphocytes Relative 22 %   Lymphs Abs 2.0 0.7 - 4.0 K/uL   Monocytes Relative 13 %   Monocytes Absolute 1.2 (H) 0.1 - 1.0 K/uL   Eosinophils Relative 3 %   Eosinophils Absolute 0.3 0.0 - 0.7 K/uL   Basophils Relative 0 %   Basophils Absolute 0.0 0.0 - 0.1 K/uL   Dg Chest 2 View 03/17/2016  CLINICAL DATA:  40 year old male with a history of chest pain EXAM: CHEST  2 VIEW COMPARISON:  None. FINDINGS:  Cardiomediastinal silhouette within normal limits in size and contour. No evidence of central vascular congestion. No evidence of interlobular septal thickening. Linear opacities at the bilateral lung bases. No pneumothorax.  No pleural effusion. No displaced fracture. IMPRESSION: No radiographic evidence of acute cardiopulmonary disease. Signed, Dulcy Fanny. Earleen Newport, DO [facility] Electronically Signed   By: Corrie Mckusick D.O.   On: 03/17/2016 08:46   US Abdomen Limited 03/17/2016  CLINICAL DATA:  Upper abdominal pain. EXAM: US ABDOMEN LIMITED - RIGHT UPPER QUADRANT COMPARISON:  02/13/2016 FINDINGS: Gallbladder: The gallbladder wall  is thickened measuring 4 mm. Multiple shadowing stones are identified which measure up to 6 mm. Negative sonographic Murphy's sign. Common bile duct: Diameter: 4.2 mm Liver: Increased parenchymal echogenicity. IMPRESSION: 1. Gallstones and gallbladder wall thickening. Findings may reflect acute cholecystitis. If further imaging is clinically indicated then a nuclear medicine hepatobiliary scan may be helpful to assess the patency of the cystic duct. 2. Echogenic liver compatible with hepatic steatosis. Electronically Signed   By: Kerby Moors M.D.   On: 03/17/2016 11:04    1115:  Pt states he "feels better" after pain meds. No N/V while in the ED. Remains afebrile. WBC count and LFT's normal.  T/C to General Surgery Dr. Arnoldo Morale, case discussed, including:  HPI, pertinent PM/SHx, VS/PE, dx testing, ED course and treatment:  rx pain meds, have pt f/u office Tuesday 10am to discuss cholecystectomy. Pt wants to go home; is agreeable with this plan. Dx and testing d/w pt.  Questions answered.  Verb understanding, agreeable to d/c home with outpt f/u.   Francine Graven, DO 03/19/16 2218

## 2016-03-17 NOTE — ED Notes (Signed)
Patient c/o mid-sternal chest pain that started this morning, waking him. Per patient was seen here in ER 1 month ago for similar pain and diagnosed with gallstones was referred to specialist in which he called and was told they would look at his scans he had here and call him back. Patient states "they never called me back." Per patient nausea, vomiting, and sweating.

## 2016-03-17 NOTE — Discharge Instructions (Signed)
Eat a bland diet, avoiding greasy, fatty, fried foods, as well as spicy and acidic foods or beverages.  Avoid eating within the hour or 2 before going to bed or laying down.  Also avoid teas, colas, coffee, chocolate, pepermint and spearment. Take the prescriptions as directed.  Go to the General Surgeon's office on Tuesday at 10am to be seen in follow up to discuss having your gallbladder removed. Return to the Emergency Department immediately if worsening.

## 2016-06-05 ENCOUNTER — Emergency Department (HOSPITAL_COMMUNITY): Payer: Self-pay

## 2016-06-05 ENCOUNTER — Emergency Department (HOSPITAL_COMMUNITY)
Admission: EM | Admit: 2016-06-05 | Discharge: 2016-06-06 | Disposition: A | Discharge status: Home / Self Care | Payer: Self-pay | Attending: Emergency Medicine | Admitting: Emergency Medicine

## 2016-06-05 ENCOUNTER — Encounter (HOSPITAL_COMMUNITY): Payer: Self-pay | Admitting: Emergency Medicine

## 2016-06-05 DIAGNOSIS — F17210 Nicotine dependence, cigarettes, uncomplicated: Secondary | ICD-10-CM | POA: Insufficient documentation

## 2016-06-05 DIAGNOSIS — J111 Influenza due to unidentified influenza virus with other respiratory manifestations: Secondary | ICD-10-CM | POA: Insufficient documentation

## 2016-06-05 DIAGNOSIS — R69 Illness, unspecified: Secondary | ICD-10-CM

## 2016-06-05 HISTORY — DX: Calculus of kidney: N20.0

## 2016-06-05 HISTORY — DX: Gastro-esophageal reflux disease without esophagitis: K21.9

## 2016-06-05 LAB — BASIC METABOLIC PANEL
Anion gap: 8 (ref 5–15)
BUN: 17 mg/dL (ref 6–20)
CHLORIDE: 98 mmol/L — AB (ref 101–111)
CO2: 25 mmol/L (ref 22–32)
CREATININE: 0.98 mg/dL (ref 0.61–1.24)
Calcium: 8.7 mg/dL — ABNORMAL LOW (ref 8.9–10.3)
Glucose, Bld: 98 mg/dL (ref 65–99)
POTASSIUM: 3.3 mmol/L — AB (ref 3.5–5.1)
SODIUM: 131 mmol/L — AB (ref 135–145)

## 2016-06-05 LAB — CBC WITH DIFFERENTIAL/PLATELET
BASOS PCT: 0 %
Basophils Absolute: 0 10*3/uL (ref 0.0–0.1)
EOS ABS: 0 10*3/uL (ref 0.0–0.7)
EOS PCT: 0 %
HCT: 44 % (ref 39.0–52.0)
HEMOGLOBIN: 15.8 g/dL (ref 13.0–17.0)
LYMPHS ABS: 0.8 10*3/uL (ref 0.7–4.0)
Lymphocytes Relative: 16 %
MCH: 32.8 pg (ref 26.0–34.0)
MCHC: 35.9 g/dL (ref 30.0–36.0)
MCV: 91.5 fL (ref 78.0–100.0)
Monocytes Absolute: 1 10*3/uL (ref 0.1–1.0)
Monocytes Relative: 21 %
NEUTROS PCT: 63 %
Neutro Abs: 3 10*3/uL (ref 1.7–7.7)
PLATELETS: 144 10*3/uL — AB (ref 150–400)
RBC: 4.81 MIL/uL (ref 4.22–5.81)
RDW: 13 % (ref 11.5–15.5)
WBC: 4.9 10*3/uL (ref 4.0–10.5)

## 2016-06-05 LAB — RAPID STREP SCREEN (MED CTR MEBANE ONLY): STREPTOCOCCUS, GROUP A SCREEN (DIRECT): NEGATIVE

## 2016-06-05 MED ORDER — METOCLOPRAMIDE HCL 5 MG/ML IJ SOLN
10.0000 mg | Freq: Once | INTRAMUSCULAR | Status: AC
  Administered 2016-06-05: 10 mg via INTRAVENOUS
  Filled 2016-06-05: qty 2

## 2016-06-05 MED ORDER — KETOROLAC TROMETHAMINE 30 MG/ML IJ SOLN
30.0000 mg | Freq: Once | INTRAMUSCULAR | Status: AC
  Administered 2016-06-05: 30 mg via INTRAVENOUS
  Filled 2016-06-05: qty 1

## 2016-06-05 MED ORDER — DIPHENHYDRAMINE HCL 50 MG/ML IJ SOLN
25.0000 mg | Freq: Once | INTRAMUSCULAR | Status: AC
  Administered 2016-06-05: 25 mg via INTRAVENOUS
  Filled 2016-06-05: qty 1

## 2016-06-05 NOTE — ED Provider Notes (Signed)
The patient is a 40 year old male, he presents with a headache which has been intermittent for the last several days, has also been having some low-grade fevers, denies changes in vision, he does state that he has had a couple episodes of vomiting. On exam the patient is well-appearing, he has no tenderness over the sinuses, no rhinorrhea or redness or white patches on the throat, no lymphadenopathy, normal heart rate, normal lung sounds, soft abdomen. Very supple neck without any meningismus. Normal mental status. Likely upper respiratory type syndrome  The pt was seen in conjunction with [facility]  Medical screening examination/treatment/procedure(s) were conducted as a shared visit with non-physician practitioner(s) and myself.  I personally evaluated the patient during the encounter.  Clinical Impression:   Final diagnoses:  Influenza-like illness         Noemi Chapel, MD 06/09/16 1132

## 2016-06-05 NOTE — ED Triage Notes (Signed)
Pt reports fever that has been intermittent since Sat. Pt reports headache onset at the same time. Pt states if he eats he vomits.

## 2016-06-06 MED ORDER — IBUPROFEN 600 MG PO TABS: 600.0000 mg | ORAL_TABLET | Freq: Four times a day (QID) | ORAL | 0 refills | Status: DC | PRN

## 2016-06-06 MED ORDER — ONDANSETRON HCL 4 MG PO TABS: 4.0000 mg | ORAL_TABLET | Freq: Four times a day (QID) | ORAL | 0 refills | Status: DC

## 2016-06-06 NOTE — ED Notes (Signed)
Pt alert & oriented x4, stable gait. Patient given discharge instructions, paperwork & prescription(s). Patient  instructed to stop at the registration desk to finish any additional paperwork. Patient verbalized understanding. Pt left department w/ no further questions. 

## 2016-06-06 NOTE — Discharge Instructions (Signed)
Drink plenty of fluids.  Tylenol every 4 hrs for fever.  Return here for any worsening symptoms

## 2016-06-07 NOTE — ED Provider Notes (Signed)
[facility] DEPT Provider Note   CSN: [id number] Arrival date & time: 06/05/16  2112     History   Chief Complaint Chief Complaint  Patient presents with  . Fever  . Headache    HPI Jeffery Mckee is a 40 y.o. male.  HPI  Jeffery Mckee is a 40 y.o. male who presents to the Emergency Department complaining of intermittent low grade fever, diffuse headache, generalized body aches, and vomiting.  He describes a throbbing headache from the base of his skull that radiates to the top of his head.  Headache gradual in onset.  Headache improves somewhat with ibuprofen.  He states vomiting is chronic and recurrent problem.  He denies abdominal pin, visual changes, numbness or weakness, neck stiffness and chest pain.     Past Medical History:  Diagnosis Date  . GERD (gastroesophageal reflux disease)   . Nephrolithiasis     There are no active problems to display for this patient.   Past Surgical History:  Procedure Laterality Date  . HERNIA REPAIR         Home Medications    Prior to Admission medications   Medication Sig Start Date End Date Taking? Authorizing Provider  HYDROcodone-acetaminophen (NORCO/VICODIN) 5-325 MG tablet 1 or 2 tabs PO q6 hours prn pain 03/17/16   Francine Graven, DO  ibuprofen (ADVIL,MOTRIN) 600 MG tablet Take 1 tablet (600 mg total) by mouth every 6 (six) hours as needed. 06/06/16   Dameisha Tschida, PA-C  omeprazole (PRILOSEC) 20 MG capsule Take 1 capsule (20 mg total) by mouth daily. Patient not taking: Reported on 03/17/2016 02/13/16   Elnora Morrison, MD  ondansetron (ZOFRAN) 4 MG tablet Take 1 tablet (4 mg total) by mouth every 6 (six) hours. 06/06/16   Rafik Koppel, PA-C    Family History Family History  Problem Relation Age of Onset  . Hypertension Mother   . Osteoarthritis Mother   . Kidney Stones Mother   . Diabetes Mother   . Cancer Father     Social History Social History  Substance Use Topics  . Smoking status: Current  Every Day Smoker    Packs/day: 1.50    Types: Cigarettes  . Smokeless tobacco: Never Used  . Alcohol use No     Allergies   Morphine and related   Review of Systems Review of Systems  Constitutional: Positive for chills and fever. Negative for activity change and appetite change.  HENT: Negative for facial swelling, sore throat and trouble swallowing.   Eyes: Negative for photophobia, pain and visual disturbance.  Respiratory: Negative for chest tightness and shortness of breath.   Cardiovascular: Negative for chest pain.  Gastrointestinal: Positive for vomiting. Negative for abdominal pain and nausea.  Genitourinary: Negative for dysuria.  Musculoskeletal: Positive for myalgias. Negative for arthralgias, neck pain and neck stiffness.  Skin: Negative for rash and wound.  Neurological: Positive for headaches. Negative for dizziness, facial asymmetry, speech difficulty, weakness and numbness.  Psychiatric/Behavioral: Negative for confusion and decreased concentration.  All other systems reviewed and are negative.    Physical Exam Updated Vital Signs BP 101/62 (BP Location: Left Arm)   Pulse 63   Temp 99.2 F (37.3 C) (Oral)   Resp 20   Ht 5\' 8"  (1.727 m)   Wt 117.9 kg   SpO2 96%   BMI 39.53 kg/m   Physical Exam  Constitutional: He is oriented to person, place, and time. He appears well-developed and well-nourished. No distress.  HENT:  Head: Normocephalic  and atraumatic.  Mouth/Throat: Oropharynx is clear and moist.  Eyes: EOM are normal. Pupils are equal, round, and reactive to light.  Neck: Normal range of motion and phonation normal. Neck supple. No spinous process tenderness and no muscular tenderness present. No neck rigidity. No Kernig's sign noted.  Cardiovascular: Normal rate, regular rhythm, normal heart sounds and intact distal pulses.   No murmur heard. Pulmonary/Chest: Effort normal and breath sounds normal. No respiratory distress.  Abdominal: Soft. He  exhibits no distension and no mass. There is no tenderness. There is no guarding.  Musculoskeletal: Normal range of motion.  Neurological: He is alert and oriented to person, place, and time. He has normal strength. No cranial nerve deficit or sensory deficit. He exhibits normal muscle tone. Coordination and gait normal. GCS eye subscore is 4. GCS verbal subscore is 5. GCS motor subscore is 6.  Reflex Scores:      Tricep reflexes are 2+ on the right side and 2+ on the left side.      Bicep reflexes are 2+ on the right side and 2+ on the left side. Skin: Skin is warm and dry. No rash noted.  Psychiatric: He has a normal mood and affect.  Nursing note and vitals reviewed.    ED Treatments / Results  Labs (all labs ordered are listed, but only abnormal results are displayed) Labs Reviewed  CBC WITH DIFFERENTIAL/PLATELET - Abnormal; Notable for the following:       Result Value   Platelets 144 (*)    All other components within normal limits  BASIC METABOLIC PANEL - Abnormal; Notable for the following:    Sodium 131 (*)    Potassium 3.3 (*)    Chloride 98 (*)    Calcium 8.7 (*)    All other components within normal limits  RAPID STREP SCREEN (NOT AT [facility])  CULTURE, GROUP A STREP [facility])    EKG  EKG Interpretation None       Radiology Dg Chest 2 View  Result Date: 06/05/2016 CLINICAL DATA:  Intermittent fever since Saturday EXAM: CHEST  2 VIEW COMPARISON:  03/17/2016 FINDINGS: The heart size and mediastinal contours are within normal limits. There is serpiginous scarring noted at the left lung base. Mild increase in interstitial prominence with peribronchial thickening is noted suggesting bronchitic change/inflammation of small airways. No effusion or pulmonary edema. The visualized skeletal structures are unremarkable. IMPRESSION: Mild increase in interstitial prominence with peribronchial thickening suggestive of small airway inflammation. Left basilar scarring. Electronically  Signed   By: Ashley Royalty M.D.   On: 06/05/2016 22:55    Procedures Procedures (including critical care time)  Medications Ordered in ED Medications  ketorolac (TORADOL) 30 MG/ML injection 30 mg (30 mg Intravenous Given 06/05/16 2249)  diphenhydrAMINE (BENADRYL) injection 25 mg (25 mg Intravenous Given 06/05/16 2244)  metoCLOPramide (REGLAN) injection 10 mg (10 mg Intravenous Given 06/05/16 2247)     Initial Impression / Assessment and Plan / ED Course  I have reviewed the triage vital signs and the nursing notes.  Pertinent labs & imaging results that were available during my care of the patient were reviewed by me and considered in my medical decision making (see chart for details).  Clinical Course   Pt is well appearing, non-toxic.  Vitals stable.   No nuchal rigidity.  No significant fever.  Labs reassuring.  Headache has resolved.  Pt reports feeling better and ready for d/c.  He has drank fluids w/o vomiting, ambulated in the dept with  a steady gait.    meningitis considered, but symptoms felt to be related to viral process.  He appears stable for d/c and agrees to return next day for recheck if not improving.  Pt also seen by Dr. Hazle Nordmann and care plan discussed.  Final Clinical Impressions(s) / ED Diagnoses   Final diagnoses:  Influenza-like illness    New Prescriptions Discharge Medication List as of 06/06/2016  1:13 AM    START taking these medications   Details  ibuprofen (ADVIL,MOTRIN) 600 MG tablet Take 1 tablet (600 mg total) by mouth every 6 (six) hours as needed., Starting Wed 06/06/2016, Print         Enas Winchel Roscoe, PA-C 06/07/16 2231    Noemi Chapel, MD 06/09/16 1132

## 2016-06-08 LAB — CULTURE, GROUP A STREP (THRC)

## 2016-06-16 ENCOUNTER — Encounter (HOSPITAL_COMMUNITY): Payer: Self-pay | Admitting: *Deleted

## 2016-06-16 ENCOUNTER — Emergency Department (HOSPITAL_COMMUNITY)
Admission: EM | Admit: 2016-06-16 | Discharge: 2016-06-17 | Disposition: A | Discharge status: Home / Self Care | Payer: Self-pay | Attending: Emergency Medicine | Admitting: Emergency Medicine

## 2016-06-16 ENCOUNTER — Emergency Department (HOSPITAL_COMMUNITY): Payer: Self-pay

## 2016-06-16 ENCOUNTER — Emergency Department (HOSPITAL_COMMUNITY)
Admission: EM | Admit: 2016-06-16 | Discharge: 2016-06-16 | Disposition: A | Discharge status: Home / Self Care | Payer: Self-pay | Attending: Emergency Medicine | Admitting: Emergency Medicine

## 2016-06-16 DIAGNOSIS — F17210 Nicotine dependence, cigarettes, uncomplicated: Secondary | ICD-10-CM | POA: Insufficient documentation

## 2016-06-16 DIAGNOSIS — Z791 Long term (current) use of non-steroidal anti-inflammatories (NSAID): Secondary | ICD-10-CM | POA: Insufficient documentation

## 2016-06-16 DIAGNOSIS — K8010 Calculus of gallbladder with chronic cholecystitis without obstruction: Secondary | ICD-10-CM | POA: Insufficient documentation

## 2016-06-16 DIAGNOSIS — R1010 Upper abdominal pain, unspecified: Secondary | ICD-10-CM

## 2016-06-16 DIAGNOSIS — R630 Anorexia: Secondary | ICD-10-CM | POA: Insufficient documentation

## 2016-06-16 DIAGNOSIS — Z79899 Other long term (current) drug therapy: Secondary | ICD-10-CM | POA: Insufficient documentation

## 2016-06-16 LAB — COMPREHENSIVE METABOLIC PANEL
ALBUMIN: 4 g/dL (ref 3.5–5.0)
ALT: 26 U/L (ref 17–63)
AST: 18 U/L (ref 15–41)
Alkaline Phosphatase: 99 U/L (ref 38–126)
Anion gap: 7 (ref 5–15)
BILIRUBIN TOTAL: 0.7 mg/dL (ref 0.3–1.2)
BUN: 12 mg/dL (ref 6–20)
CHLORIDE: 98 mmol/L — AB (ref 101–111)
CO2: 29 mmol/L (ref 22–32)
CREATININE: 0.67 mg/dL (ref 0.61–1.24)
Calcium: 9.2 mg/dL (ref 8.9–10.3)
GFR calc Af Amer: >60 mL/min (ref >60)
GLUCOSE: 120 mg/dL — AB (ref 65–99)
Potassium: 3.4 mmol/L — ABNORMAL LOW (ref 3.5–5.1)
Sodium: 134 mmol/L — ABNORMAL LOW (ref 135–145)
TOTAL PROTEIN: 7.7 g/dL (ref 6.5–8.1)

## 2016-06-16 LAB — BASIC METABOLIC PANEL
Anion gap: 7 (ref 5–15)
BUN: 15 mg/dL (ref 6–20)
CHLORIDE: 101 mmol/L (ref 101–111)
CO2: 27 mmol/L (ref 22–32)
CREATININE: 0.64 mg/dL (ref 0.61–1.24)
Calcium: 9.2 mg/dL (ref 8.9–10.3)
GFR calc Af Amer: >60 mL/min (ref >60)
GFR calc non Af Amer: >60 mL/min (ref >60)
Glucose, Bld: 136 mg/dL — ABNORMAL HIGH (ref 65–99)
POTASSIUM: 3.6 mmol/L (ref 3.5–5.1)
SODIUM: 135 mmol/L (ref 135–145)

## 2016-06-16 LAB — CBC WITH DIFFERENTIAL/PLATELET
BASOS ABS: 0 10*3/uL (ref 0.0–0.1)
BASOS PCT: 0 %
Eosinophils Absolute: 0.1 10*3/uL (ref 0.0–0.7)
Eosinophils Relative: 0 %
HEMATOCRIT: 42.2 % (ref 39.0–52.0)
Hemoglobin: 14.6 g/dL (ref 13.0–17.0)
LYMPHS ABS: 0.9 10*3/uL (ref 0.7–4.0)
LYMPHS PCT: 5 %
MCH: 32.1 pg (ref 26.0–34.0)
MCHC: 34.6 g/dL (ref 30.0–36.0)
MCV: 92.7 fL (ref 78.0–100.0)
Monocytes Absolute: 2 10*3/uL — ABNORMAL HIGH (ref 0.1–1.0)
Monocytes Relative: 12 %
Neutro Abs: 13.9 10*3/uL — ABNORMAL HIGH (ref 1.7–7.7)
Neutrophils Relative %: 83 %
Platelets: 366 10*3/uL (ref 150–400)
RBC: 4.55 MIL/uL (ref 4.22–5.81)
RDW: 12.9 % (ref 11.5–15.5)
WBC: 16.8 10*3/uL — ABNORMAL HIGH (ref 4.0–10.5)

## 2016-06-16 LAB — CBC
HEMATOCRIT: 41.4 % (ref 39.0–52.0)
Hemoglobin: 14.2 g/dL (ref 13.0–17.0)
MCH: 31.8 pg (ref 26.0–34.0)
MCHC: 34.3 g/dL (ref 30.0–36.0)
MCV: 92.6 fL (ref 78.0–100.0)
PLATELETS: 351 10*3/uL (ref 150–400)
RBC: 4.47 MIL/uL (ref 4.22–5.81)
RDW: 12.9 % (ref 11.5–15.5)
WBC: 11.1 10*3/uL — AB (ref 4.0–10.5)

## 2016-06-16 LAB — HEPATIC FUNCTION PANEL
ALBUMIN: 3.9 g/dL (ref 3.5–5.0)
ALK PHOS: 97 U/L (ref 38–126)
ALT: 26 U/L (ref 17–63)
AST: 18 U/L (ref 15–41)
BILIRUBIN TOTAL: 0.8 mg/dL (ref 0.3–1.2)
Bilirubin, Direct: 0.1 mg/dL (ref 0.1–0.5)
Indirect Bilirubin: 0.7 mg/dL (ref 0.3–0.9)
TOTAL PROTEIN: 7.5 g/dL (ref 6.5–8.1)

## 2016-06-16 LAB — LIPASE, BLOOD
LIPASE: 17 U/L (ref 11–51)
Lipase: 16 U/L (ref 11–51)

## 2016-06-16 LAB — TROPONIN I
Troponin I: <0.03 ng/mL (ref <0.03)
Troponin I: <0.03 ng/mL (ref <0.03)

## 2016-06-16 MED ORDER — FAMOTIDINE IN NACL 20-0.9 MG/50ML-% IV SOLN
20.0000 mg | Freq: Once | INTRAVENOUS | Status: AC
  Administered 2016-06-16: 20 mg via INTRAVENOUS
  Filled 2016-06-16 (×2): qty 50

## 2016-06-16 MED ORDER — ONDANSETRON HCL 4 MG/2ML IJ SOLN
4.0000 mg | Freq: Once | INTRAMUSCULAR | Status: AC
  Administered 2016-06-16: 4 mg via INTRAVENOUS
  Filled 2016-06-16: qty 2

## 2016-06-16 MED ORDER — FAMOTIDINE 20 MG PO TABS
20.0000 mg | ORAL_TABLET | Freq: Once | ORAL | Status: AC
  Administered 2016-06-16: 20 mg via ORAL
  Filled 2016-06-16: qty 1

## 2016-06-16 MED ORDER — HYDROMORPHONE HCL 1 MG/ML IJ SOLN
1.0000 mg | Freq: Once | INTRAMUSCULAR | Status: AC
  Administered 2016-06-16: 1 mg via INTRAVENOUS
  Filled 2016-06-16: qty 1

## 2016-06-16 MED ORDER — HYDROMORPHONE HCL 1 MG/ML IJ SOLN
1.0000 mg | Freq: Once | INTRAMUSCULAR | Status: AC
  Administered 2016-06-16: 1 mg via INTRAVENOUS
  Filled 2016-06-16 (×2): qty 1

## 2016-06-16 MED ORDER — HYDROMORPHONE HCL 1 MG/ML IJ SOLN
INTRAMUSCULAR | Status: AC
  Administered 2016-06-16: 1 mg via INTRAVENOUS
  Filled 2016-06-16: qty 1

## 2016-06-16 MED ORDER — ONDANSETRON 4 MG PO TBDP: ORAL_TABLET | ORAL | 0 refills | Status: DC

## 2016-06-16 MED ORDER — HYDROMORPHONE HCL 4 MG PO TABS: 4.0000 mg | ORAL_TABLET | ORAL | 0 refills | Status: DC | PRN

## 2016-06-16 MED ORDER — PANTOPRAZOLE SODIUM 40 MG PO TBEC
40.0000 mg | DELAYED_RELEASE_TABLET | Freq: Once | ORAL | Status: AC
  Administered 2016-06-16: 40 mg via ORAL
  Filled 2016-06-16: qty 1

## 2016-06-16 MED ORDER — PROMETHAZINE HCL 12.5 MG PO TABS
25.0000 mg | ORAL_TABLET | Freq: Once | ORAL | Status: AC
  Administered 2016-06-16: 25 mg via ORAL
  Filled 2016-06-16: qty 2

## 2016-06-16 NOTE — ED Provider Notes (Signed)
[facility] DEPT Provider Note   CSN: [id number] Arrival date & time: 06/16/16  1136     History   Chief Complaint Chief Complaint  Patient presents with  . Chest Pain    HPI VARDAAN LEIDIG is a 40 y.o. male.  Patient complains of upper Epigastric abdominal pain   The history is provided by the patient. No language interpreter was used.  Abdominal Pain   This is a new problem. The current episode started 12 to 24 hours ago. The problem occurs constantly. The problem has not changed since onset.Associated with: Nothing. The pain is located in the epigastric region. The pain is at a severity of 6/10. The pain is moderate. Associated symptoms include anorexia. Pertinent negatives include diarrhea, frequency, hematuria and headaches.    Past Medical History:  Diagnosis Date  . GERD (gastroesophageal reflux disease)   . Nephrolithiasis     There are no active problems to display for this patient.   Past Surgical History:  Procedure Laterality Date  . HERNIA REPAIR         Home Medications    Prior to Admission medications   Medication Sig Start Date End Date Taking? Authorizing Provider  HYDROcodone-acetaminophen (NORCO/VICODIN) 5-325 MG tablet 1 or 2 tabs PO q6 hours prn pain 03/17/16  Yes Francine Graven, DO  pantoprazole (PROTONIX) 20 MG tablet Take 20 mg by mouth daily.   Yes Historical Provider, MD  HYDROmorphone (DILAUDID) 4 MG tablet Take 1 tablet (4 mg total) by mouth every 4 (four) hours as needed for severe pain. 06/16/16   Milton Ferguson, MD  ibuprofen (ADVIL,MOTRIN) 600 MG tablet Take 1 tablet (600 mg total) by mouth every 6 (six) hours as needed. 06/06/16   Tammy Triplett, PA-C  omeprazole (PRILOSEC) 20 MG capsule Take 1 capsule (20 mg total) by mouth daily. Patient not taking: Reported on 03/17/2016 02/13/16   Elnora Morrison, MD  ondansetron (ZOFRAN ODT) 4 MG disintegrating tablet 4mg  ODT q4 hours prn nausea/vomit 06/16/16   Milton Ferguson, MD    ondansetron (ZOFRAN) 4 MG tablet Take 1 tablet (4 mg total) by mouth every 6 (six) hours. 06/06/16   Tammy Triplett, PA-C    Family History Family History  Problem Relation Age of Onset  . Hypertension Mother   . Osteoarthritis Mother   . Kidney Stones Mother   . Diabetes Mother   . Cancer Father     Social History Social History  Substance Use Topics  . Smoking status: Current Every Day Smoker    Packs/day: 1.50    Types: Cigarettes  . Smokeless tobacco: Never Used  . Alcohol use No     Allergies   Morphine and related   Review of Systems Review of Systems  Constitutional: Negative for appetite change and fatigue.  HENT: Negative for congestion, ear discharge and sinus pressure.   Eyes: Negative for discharge.  Respiratory: Negative for cough.   Cardiovascular: Negative for chest pain.  Gastrointestinal: Positive for abdominal pain and anorexia. Negative for diarrhea.  Genitourinary: Negative for frequency and hematuria.  Musculoskeletal: Negative for back pain.  Skin: Negative for rash.  Neurological: Negative for seizures and headaches.  Psychiatric/Behavioral: Negative for hallucinations.     Physical Exam Updated Vital Signs BP 127/67   Pulse 62   Temp 97.5 F (36.4 C) (Oral)   Resp 16   Ht 5\' 7"  (1.702 m)   Wt 260 lb (117.9 kg)   SpO2 97%   BMI 40.72 kg/m   Physical  Exam  Constitutional: He is oriented to person, place, and time. He appears well-developed.  HENT:  Head: Normocephalic.  Eyes: Conjunctivae and EOM are normal. No scleral icterus.  Neck: Neck supple. No thyromegaly present.  Cardiovascular: Normal rate and regular rhythm.  Exam reveals no gallop and no friction rub.   No murmur heard. Pulmonary/Chest: No stridor. He has no wheezes. He has no rales. He exhibits no tenderness.  Abdominal: He exhibits no distension. There is tenderness. There is no rebound.  Musculoskeletal: Normal range of motion. He exhibits no edema.   Lymphadenopathy:    He has no cervical adenopathy.  Neurological: He is oriented to person, place, and time. He exhibits normal muscle tone. Coordination normal.  Skin: No rash noted. No erythema.  Psychiatric: He has a normal mood and affect. His behavior is normal.     ED Treatments / Results  Labs (all labs ordered are listed, but only abnormal results are displayed) Labs Reviewed  BASIC METABOLIC PANEL - Abnormal; Notable for the following:       Result Value   Glucose, Bld 136 (*)    All other components within normal limits  CBC - Abnormal; Notable for the following:    WBC 11.1 (*)    All other components within normal limits  TROPONIN I  LIPASE, BLOOD  HEPATIC FUNCTION PANEL    EKG  EKG Interpretation None       Radiology Dg Chest 2 View  Result Date: 06/16/2016 CLINICAL DATA:  Chest pain EXAM: CHEST  2 VIEW COMPARISON:  June 05, 2016 FINDINGS: No pneumothorax. The heart size remains borderline. The hila are symmetric and unchanged. The mediastinum is unremarkable. No evidence of mediastinal air. No pulmonary nodules, masses, or focal infiltrates. IMPRESSION: No active cardiopulmonary disease. Electronically Signed   By: Dorise Bullion III M.D   On: 06/16/2016 12:44    Procedures Procedures (including critical care time)  Medications Ordered in ED Medications  HYDROmorphone (DILAUDID) injection 1 mg (1 mg Intravenous Given 06/16/16 1244)  famotidine (PEPCID) IVPB 20 mg premix (0 mg Intravenous Stopped 06/16/16 1355)  ondansetron (ZOFRAN) injection 4 mg (4 mg Intravenous Given 06/16/16 1244)  HYDROmorphone (DILAUDID) injection 1 mg (1 mg Intravenous Given 06/16/16 1358)     Initial Impression / Assessment and Plan / ED Course  I have reviewed the triage vital signs and the nursing notes.  Pertinent labs & imaging results that were available during my care of the patient were reviewed by me and considered in my medical decision making (see chart for  details).  Clinical Course    Patient with abdominal pain and known gallstones. Suspect his pain is related to his gallstones. Patient does not want to have an operation today to have his gallstones out. His liver studies were normal. I spoke with the general surgeon Dr. Arnoldo Morale and he will follow-up Tuesday with the patient  Final Clinical Impressions(s) / ED Diagnoses   Final diagnoses:  Pain of upper abdomen    New Prescriptions New Prescriptions   HYDROMORPHONE (DILAUDID) 4 MG TABLET    Take 1 tablet (4 mg total) by mouth every 4 (four) hours as needed for severe pain.   ONDANSETRON (ZOFRAN ODT) 4 MG DISINTEGRATING TABLET    4mg  ODT q4 hours prn nausea/vomit     Milton Ferguson, MD 06/16/16 [phone]

## 2016-06-16 NOTE — ED Triage Notes (Signed)
Pt seen here earlier and received pain prescriptions and was unable to get filled.  Went to [facility] and pt states that they did not have it.

## 2016-06-16 NOTE — ED Notes (Signed)
Pt reports his "gallbladder is acting up again". Was seen here today for same issue and was given a prescription, states the pharmacy was unable to fill it due to amount. Area pharmacies closed and came here for pain relief.

## 2016-06-16 NOTE — Discharge Instructions (Signed)
Follow-up with Dr. Arnoldo Morale on Tuesday. Return sooner if problems

## 2016-06-16 NOTE — ED Provider Notes (Signed)
[facility] DEPT Provider Note   CSN: [id number] Arrival date & time: 06/16/16  2047  By signing my name below, I, Jeffery Mckee, attest that this documentation has been prepared under the direction and in the presence of  Lily Kocher, PA-C. Electronically Signed: Royce Mckee, ED Scribe. 06/16/16. 10:32 PM.  History   Chief Complaint Chief Complaint  Patient presents with  . Chest Pain   The history is provided by the patient and medical records. No language interpreter was used.    HPI Comments:  Jeffery Mckee is a 40 y.o. male who presents to the Emergency Department complaining of vomiting and chest pain in his sternum.  He also notes sulfur tasting burps.  Pt was seen in the ED earlier today with the same complaint.  He reports that he is back because his pharmacy could not fill his prescription and was told he needed to return.  He reports that he has a history of gallstones and that this feels similar to past flair ups.  Pt denies additional complications or worsening of his condition since the previous visit.    Past Medical History:  Diagnosis Date  . GERD (gastroesophageal reflux disease)   . Nephrolithiasis     There are no active problems to display for this patient.   Past Surgical History:  Procedure Laterality Date  . HERNIA REPAIR         Home Medications    Prior to Admission medications   Medication Sig Start Date End Date Taking? Authorizing Provider  HYDROcodone-acetaminophen (NORCO/VICODIN) 5-325 MG tablet 1 or 2 tabs PO q6 hours prn pain 03/17/16   Francine Graven, DO  HYDROmorphone (DILAUDID) 4 MG tablet Take 1 tablet (4 mg total) by mouth every 4 (four) hours as needed for severe pain. 06/16/16   Milton Ferguson, MD  ibuprofen (ADVIL,MOTRIN) 600 MG tablet Take 1 tablet (600 mg total) by mouth every 6 (six) hours as needed. 06/06/16   Tammy Triplett, PA-C  omeprazole (PRILOSEC) 20 MG capsule Take 1 capsule (20 mg total) by mouth  daily. Patient not taking: Reported on 03/17/2016 02/13/16   Elnora Morrison, MD  ondansetron (ZOFRAN ODT) 4 MG disintegrating tablet 4mg  ODT q4 hours prn nausea/vomit 06/16/16   Milton Ferguson, MD  ondansetron (ZOFRAN) 4 MG tablet Take 1 tablet (4 mg total) by mouth every 6 (six) hours. 06/06/16   Tammy Triplett, PA-C  pantoprazole (PROTONIX) 20 MG tablet Take 20 mg by mouth daily.    Historical Provider, MD    Family History Family History  Problem Relation Age of Onset  . Hypertension Mother   . Osteoarthritis Mother   . Kidney Stones Mother   . Diabetes Mother   . Cancer Father     Social History Social History  Substance Use Topics  . Smoking status: Current Every Day Smoker    Packs/day: 1.50    Types: Cigarettes  . Smokeless tobacco: Never Used  . Alcohol use No     Allergies   Morphine and related   Review of Systems Review of Systems  Cardiovascular: Positive for chest pain.  Gastrointestinal: Positive for vomiting.  All other systems reviewed and are negative.    Physical Exam Updated Vital Signs BP 127/77 (BP Location: Left Arm)   Pulse 61   Temp 97.7 F (36.5 C) (Oral)   Resp 16   SpO2 98%   Physical Exam  Constitutional: He is oriented to person, place, and time. He appears well-developed and well-nourished. No distress.  HENT:  Head: Normocephalic and atraumatic.  Eyes: Conjunctivae are normal.  Cardiovascular: Normal rate, regular rhythm and normal heart sounds.  Exam reveals no friction rub.   No murmur heard. No edema.  No swelling of the calves.   Pulmonary/Chest: Effort normal and breath sounds normal. No respiratory distress. He has no wheezes. He has no rales.  Abdominal:  Pain in the epigastric area to palpation.  Abdomen is soft.  Bowel sounds are active.    Neurological: He is alert and oriented to person, place, and time.  Skin: Skin is warm and dry. Capillary refill takes less than 2 seconds.  Psychiatric: He has a normal mood and  affect.  Nursing note and vitals reviewed.    ED Treatments / Results   DIAGNOSTIC STUDIES:  Oxygen Saturation is 98% on RA, NML by my interpretation.    COORDINATION OF CARE:  10:32 PM Recheck his hepatic function studies and lipase. Treat with H2 blockers, proton pump inhibitors and nasea medication.  Discussed treatment plan with pt at bedside and pt agreed to plan.  Labs (all labs ordered are listed, but only abnormal results are displayed) Labs Reviewed - No data to display  EKG  EKG Interpretation None       Radiology Dg Chest 2 View  Result Date: 06/16/2016 CLINICAL DATA:  Chest pain EXAM: CHEST  2 VIEW COMPARISON:  June 05, 2016 FINDINGS: No pneumothorax. The heart size remains borderline. The hila are symmetric and unchanged. The mediastinum is unremarkable. No evidence of mediastinal air. No pulmonary nodules, masses, or focal infiltrates. IMPRESSION: No active cardiopulmonary disease. Electronically Signed   By: Dorise Bullion III M.D   On: 06/16/2016 12:44    Procedures Procedures (including critical care time)  Medications Ordered in ED Medications - No data to display   Initial Impression / Assessment and Plan / ED Course  I have reviewed the triage vital signs and the nursing notes.  Pertinent labs & imaging results that were available during my care of the patient were reviewed by me and considered in my medical decision making (see chart for details).  Clinical Course    *I have reviewed nursing notes, vital signs, and all appropriate lab and imaging results for this patient.**  Final Clinical Impressions(s) / ED Diagnoses   vital signs are wnl. Pt states his medication was wearing off, he began to be sick with 3 episodes of vomiting. He could not get his Rx filled. He returned to get medication for the night. WBC elevated, but probably related to vomiting. No fever, no tachycardia, no changes in LFT's or lipase. Troponin neg for acute  event. Pt to be given To Go pack of percocet and advised to get his Rx filled and see the surgeon as suggested.   Final diagnoses:  None    New Prescriptions New Prescriptions   No medications on file   **I personally performed the services described in this documentation, which was scribed in my presence. The recorded information has been reviewed and is accurate.Lily Kocher, PA-C 06/17/16 [place], DO 06/19/16 [id number]

## 2016-06-16 NOTE — ED Triage Notes (Signed)
Pt with mid CP since 0700 with emesis x 3 today.  Pt with known gallstones and takes Protonix and pt took 2 this morning.

## 2016-06-17 MED ORDER — HYDROMORPHONE HCL 1 MG/ML IJ SOLN
1.0000 mg | Freq: Once | INTRAMUSCULAR | Status: DC
  Filled 2016-06-17: qty 1

## 2016-06-17 MED ORDER — HYDROMORPHONE HCL 1 MG/ML IJ SOLN
1.0000 mg | Freq: Once | INTRAMUSCULAR | Status: AC
  Administered 2016-06-17: 1 mg via INTRAMUSCULAR

## 2016-06-17 MED ORDER — OXYCODONE-ACETAMINOPHEN 5-325 MG PO TABS: 1.0000 | ORAL_TABLET | ORAL | 0 refills | Status: DC | PRN

## 2016-06-17 NOTE — Discharge Instructions (Signed)
It is important that you see Dr Arnoldo Morale or the general surgeon of your choice as soon as possible. Your vital signs are within normal limits at this time. Use your medications as suggested. Return to the ED if any emergent changes or problem.

## 2016-06-19 MED FILL — Oxycodone w/ Acetaminophen Tab 5-325 MG: ORAL | Qty: 6 | Status: AC

## 2016-09-07 ENCOUNTER — Emergency Department (HOSPITAL_COMMUNITY)
Admission: EM | Admit: 2016-09-07 | Discharge: 2016-09-07 | Disposition: A | Discharge status: Home / Self Care | Payer: BLUE CROSS/BLUE SHIELD | Attending: Emergency Medicine | Admitting: Emergency Medicine

## 2016-09-07 ENCOUNTER — Emergency Department (HOSPITAL_COMMUNITY): Payer: BLUE CROSS/BLUE SHIELD

## 2016-09-07 ENCOUNTER — Encounter (HOSPITAL_COMMUNITY): Payer: Self-pay | Admitting: Emergency Medicine

## 2016-09-07 DIAGNOSIS — F17210 Nicotine dependence, cigarettes, uncomplicated: Secondary | ICD-10-CM | POA: Diagnosis not present

## 2016-09-07 DIAGNOSIS — R1030 Lower abdominal pain, unspecified: Secondary | ICD-10-CM

## 2016-09-07 DIAGNOSIS — Z79899 Other long term (current) drug therapy: Secondary | ICD-10-CM | POA: Insufficient documentation

## 2016-09-07 DIAGNOSIS — R1013 Epigastric pain: Secondary | ICD-10-CM | POA: Insufficient documentation

## 2016-09-07 LAB — COMPREHENSIVE METABOLIC PANEL
ALBUMIN: 4.2 g/dL (ref 3.5–5.0)
ALK PHOS: 93 U/L (ref 38–126)
ALT: 34 U/L (ref 17–63)
AST: 21 U/L (ref 15–41)
Anion gap: 6 (ref 5–15)
BILIRUBIN TOTAL: 0.3 mg/dL (ref 0.3–1.2)
BUN: 21 mg/dL — AB (ref 6–20)
CALCIUM: 9.1 mg/dL (ref 8.9–10.3)
CO2: 28 mmol/L (ref 22–32)
CREATININE: 0.73 mg/dL (ref 0.61–1.24)
Chloride: 104 mmol/L (ref 101–111)
GFR calc Af Amer: >60 mL/min (ref >60)
GLUCOSE: 133 mg/dL — AB (ref 65–99)
POTASSIUM: 3.8 mmol/L (ref 3.5–5.1)
Sodium: 138 mmol/L (ref 135–145)
TOTAL PROTEIN: 8 g/dL (ref 6.5–8.1)

## 2016-09-07 LAB — CBC
HEMATOCRIT: 45.8 % (ref 39.0–52.0)
Hemoglobin: 15.4 g/dL (ref 13.0–17.0)
MCH: 32.2 pg (ref 26.0–34.0)
MCHC: 33.6 g/dL (ref 30.0–36.0)
MCV: 95.8 fL (ref 78.0–100.0)
PLATELETS: 313 10*3/uL (ref 150–400)
RBC: 4.78 MIL/uL (ref 4.22–5.81)
RDW: 13.5 % (ref 11.5–15.5)
WBC: 8.6 10*3/uL (ref 4.0–10.5)

## 2016-09-07 LAB — URINALYSIS, ROUTINE W REFLEX MICROSCOPIC
BACTERIA UA: NONE SEEN
BILIRUBIN URINE: NEGATIVE
GLUCOSE, UA: NEGATIVE mg/dL
Hgb urine dipstick: NEGATIVE
Ketones, ur: NEGATIVE mg/dL
Leukocytes, UA: NEGATIVE
NITRITE: NEGATIVE
PH: 6 (ref 5.0–8.0)
Protein, ur: NEGATIVE mg/dL
SPECIFIC GRAVITY, URINE: 1.025 (ref 1.005–1.030)

## 2016-09-07 LAB — LIPASE, BLOOD: Lipase: 17 U/L (ref 11–51)

## 2016-09-07 MED ORDER — ONDANSETRON HCL 4 MG/2ML IJ SOLN
4.0000 mg | Freq: Once | INTRAMUSCULAR | Status: AC
  Administered 2016-09-07: 4 mg via INTRAVENOUS
  Filled 2016-09-07 (×2): qty 2

## 2016-09-07 MED ORDER — ONDANSETRON 4 MG PO TBDP: ORAL_TABLET | ORAL | 0 refills | Status: DC

## 2016-09-07 MED ORDER — SODIUM CHLORIDE 0.9 % IV BOLUS (SEPSIS)
2000.0000 mL | Freq: Once | INTRAVENOUS | Status: AC
  Administered 2016-09-07: 2000 mL via INTRAVENOUS

## 2016-09-07 MED ORDER — HYDROMORPHONE HCL 1 MG/ML IJ SOLN
1.0000 mg | Freq: Once | INTRAMUSCULAR | Status: AC
  Administered 2016-09-07: 1 mg via INTRAVENOUS
  Filled 2016-09-07: qty 1

## 2016-09-07 MED ORDER — HYDROMORPHONE HCL 1 MG/ML IJ SOLN
1.0000 mg | Freq: Once | INTRAMUSCULAR | Status: AC
  Administered 2016-09-07: 1 mg via INTRAVENOUS
  Filled 2016-09-07 (×2): qty 1

## 2016-09-07 MED ORDER — IOPAMIDOL (ISOVUE-300) INJECTION 61%
INTRAVENOUS | Status: AC
  Administered 2016-09-07: 30 mL
  Filled 2016-09-07: qty 30

## 2016-09-07 MED ORDER — TRAMADOL HCL 50 MG PO TABS: 50.0000 mg | ORAL_TABLET | Freq: Four times a day (QID) | ORAL | 0 refills | Status: DC | PRN

## 2016-09-07 MED ORDER — IOPAMIDOL (ISOVUE-300) INJECTION 61%
100.0000 mL | Freq: Once | INTRAVENOUS | Status: AC | PRN
  Administered 2016-09-07: 100 mL via INTRAVENOUS

## 2016-09-07 MED ORDER — ONDANSETRON HCL 4 MG/2ML IJ SOLN
4.0000 mg | Freq: Once | INTRAMUSCULAR | Status: AC
  Administered 2016-09-07: 4 mg via INTRAVENOUS
  Filled 2016-09-07: qty 2

## 2016-09-07 MED ORDER — PANTOPRAZOLE SODIUM 40 MG IV SOLR
40.0000 mg | Freq: Once | INTRAVENOUS | Status: AC
  Administered 2016-09-07: 40 mg via INTRAVENOUS
  Filled 2016-09-07: qty 40

## 2016-09-07 NOTE — ED Triage Notes (Signed)
Patient states abdominal pain and nausea. States pain started this morning with vomiting. States history of gaul stones. Denies fever/diarrhea.

## 2016-09-07 NOTE — ED Notes (Signed)
Patient is resting comfortably. 

## 2016-09-07 NOTE — ED Provider Notes (Addendum)
[facility] DEPT Provider Note   CSN: [id number] Arrival date & time: 09/07/16  1042     History   Chief Complaint Chief Complaint  Patient presents with  . Abdominal Pain    HPI Jeffery Mckee is a 41 y.o. male.  Patient complains of abdominal pain and vomiting no diarrhea. Patient has a history of gallstones   The history is provided by the patient. No language interpreter was used.  Abdominal Pain   This is a new problem. The problem occurs constantly. The problem has been resolved. The pain is associated with an unknown factor. The pain is located in the epigastric region. The pain is at a severity of 4/10. The pain is moderate. Pertinent negatives include anorexia, diarrhea, frequency, hematuria and headaches.    Past Medical History:  Diagnosis Date  . GERD (gastroesophageal reflux disease)   . Nephrolithiasis     There are no active problems to display for this patient.   Past Surgical History:  Procedure Laterality Date  . HERNIA REPAIR         Home Medications    Prior to Admission medications   Medication Sig Start Date End Date Taking? Authorizing Provider  esomeprazole (NEXIUM) 20 MG capsule Take 20 mg by mouth daily at 12 noon.   Yes Historical Provider, MD  HYDROcodone-acetaminophen (NORCO/VICODIN) 5-325 MG tablet 1 or 2 tabs PO q6 hours prn pain Patient taking differently: Take 1-2 tablets by mouth every 6 (six) hours as needed for moderate pain or severe pain.  03/17/16  Yes Francine Graven, DO  HYDROmorphone (DILAUDID) 4 MG tablet Take 1 tablet (4 mg total) by mouth every 4 (four) hours as needed for severe pain. 06/16/16  Yes Milton Ferguson, MD  ondansetron (ZOFRAN) 4 MG tablet Take 1 tablet (4 mg total) by mouth every 6 (six) hours. 06/06/16  Yes Tammy Triplett, PA-C  ibuprofen (ADVIL,MOTRIN) 600 MG tablet Take 1 tablet (600 mg total) by mouth every 6 (six) hours as needed. Patient not taking: Reported on 09/07/2016 06/06/16   Tammy Triplett,  PA-C  omeprazole (PRILOSEC) 20 MG capsule Take 1 capsule (20 mg total) by mouth daily. Patient not taking: Reported on 03/17/2016 02/13/16   Elnora Morrison, MD  ondansetron (ZOFRAN ODT) 4 MG disintegrating tablet 4mg  ODT q4 hours prn nausea/vomit 09/07/16   Milton Ferguson, MD  oxyCODONE-acetaminophen (PERCOCET/ROXICET) 5-325 MG tablet Take 1-2 tablets by mouth every 4 (four) hours as needed for severe pain. Patient not taking: Reported on 09/07/2016 06/17/16   Lily Kocher, PA-C  traMADol (ULTRAM) 50 MG tablet Take 1 tablet (50 mg total) by mouth every 6 (six) hours as needed. 09/07/16   Milton Ferguson, MD    Family History Family History  Problem Relation Age of Onset  . Hypertension Mother   . Osteoarthritis Mother   . Kidney Stones Mother   . Diabetes Mother   . Cancer Father     Social History Social History  Substance Use Topics  . Smoking status: Current Every Day Smoker    Packs/day: 1.50    Types: Cigarettes  . Smokeless tobacco: Never Used  . Alcohol use No     Allergies   Morphine and related   Review of Systems Review of Systems  Constitutional: Negative for appetite change and fatigue.  HENT: Negative for congestion, ear discharge and sinus pressure.   Eyes: Negative for discharge.  Respiratory: Negative for cough.   Cardiovascular: Negative for chest pain.  Gastrointestinal: Positive for abdominal pain. Negative  for anorexia and diarrhea.  Genitourinary: Negative for frequency and hematuria.  Musculoskeletal: Negative for back pain.  Skin: Negative for rash.  Neurological: Negative for seizures and headaches.  Psychiatric/Behavioral: Negative for hallucinations.     Physical Exam Updated Vital Signs BP 144/75 (BP Location: Left Arm)   Pulse 61   Temp 97.8 F (36.6 C) (Oral)   Resp 19   Ht 5\' 7"  (1.702 m)   Wt 260 lb (117.9 kg)   SpO2 97%   BMI 40.72 kg/m   Physical Exam  Constitutional: He is oriented to person, place, and time. He appears  well-developed.  HENT:  Head: Normocephalic.  Eyes: Conjunctivae and EOM are normal. No scleral icterus.  Neck: Neck supple. No thyromegaly present.  Cardiovascular: Normal rate and regular rhythm.  Exam reveals no gallop and no friction rub.   No murmur heard. Pulmonary/Chest: No stridor. He has no wheezes. He has no rales. He exhibits no tenderness.  Abdominal: He exhibits no distension. There is tenderness. There is no rebound.  Mild tenderness in epigastric  Musculoskeletal: Normal range of motion. He exhibits no edema.  Lymphadenopathy:    He has no cervical adenopathy.  Neurological: He is oriented to person, place, and time. He exhibits normal muscle tone. Coordination normal.  Skin: No rash noted. No erythema.  Psychiatric: He has a normal mood and affect. His behavior is normal.     ED Treatments / Results  Labs (all labs ordered are listed, but only abnormal results are displayed) Labs Reviewed  COMPREHENSIVE METABOLIC PANEL - Abnormal; Notable for the following:       Result Value   Glucose, Bld 133 (*)    BUN 21 (*)    All other components within normal limits  LIPASE, BLOOD  CBC  URINALYSIS, ROUTINE W REFLEX MICROSCOPIC    EKG  EKG Interpretation None       Radiology Ct Abdomen Pelvis W Contrast  Result Date: 09/07/2016 CLINICAL DATA:  Abdominal pain and nausea starting this morning with vomiting. Gallstones. EXAM: CT ABDOMEN AND PELVIS WITH CONTRAST TECHNIQUE: Multidetector CT imaging of the abdomen and pelvis was performed using the standard protocol following bolus administration of intravenous contrast. CONTRAST:  123mL ISOVUE-300 IOPAMIDOL (ISOVUE-300) INJECTION 61%, 1 ISOVUE-300 IOPAMIDOL (ISOVUE-300) INJECTION 61% COMPARISON:  Report from 07/07/2011 CT, abdominal ultrasound from 03/17/2016 FINDINGS: Lower chest: Normal sized cardiac chambers. Bibasilar dependent atelectasis. Hepatobiliary: 5 mm right hepatic hypodensity too small to further characterize,  statistically consistent with a cyst or hemangioma. Cholelithiasis without pericholecystic fluid nor gallbladder wall thickening. No biliary dilatation. 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 Spleen: No splenomegaly. Adrenals/Urinary Tract: Normal bilateral adrenal glands. Cysts of the right upper pole the kidney, the largest is simple measuring 2.9 x 2.8 x 4.5 cm. Adjacent tiny subcentimeter cyst too small to further characterize is noted. Left kidney is unremarkable. There is no obstructive uropathy. Urinary bladder is physiologically distended. Possible 2 mm tiny calculus along the dependent wall, series 2 image 85 potentially representing a passed stone. Stomach/Bowel: Stomach is distended with oral contrast. There is mild distention of duodenal and jejunal loops suspicious for enteritis. No bowel obstruction. Moderate colonic stool burden. Normal appendix. Left-sided colonic spasm. Vascular/Lymphatic: Aortoiliac atherosclerosis without aneurysm. Reproductive: Prostate is unremarkable. Other: Small fat containing umbilical hernia. No abdominopelvic ascites. Musculoskeletal: No acute osseous abnormality. Osteophytes along the dorsal spine anteriorly from T7 through T10. Disc space narrowing at L5-S1. IMPRESSION: 1. Mild distention of proximal  small bowel consistent with enteritis. No bowel obstruction. 2. Renal and hepatic hypodensity statistically consistent with cysts the largest is in the upper pole right renal cyst measuring 2.9 x 2.8 x 4.5 cm. 3. Uncomplicated cholelithiasis. 4. Possible tiny bladder calculus along the dependent wall. 5. Fat containing umbilical hernia. Electronically Signed   By: Ashley Royalty M.D.   On: 09/07/2016 20:24    Procedures Procedures (including critical care time)  Medications Ordered in ED Medications  sodium chloride 0.9 % bolus 2,000 mL (0 mLs Intravenous Stopped 09/07/16 1746)  pantoprazole (PROTONIX) injection 40 mg (40 mg  Intravenous Given 09/07/16 1613)  HYDROmorphone (DILAUDID) injection 1 mg (1 mg Intravenous Given 09/07/16 1613)  ondansetron (ZOFRAN) injection 4 mg (4 mg Intravenous Given 09/07/16 1609)  HYDROmorphone (DILAUDID) injection 1 mg (1 mg Intravenous Given 09/07/16 1747)  ondansetron (ZOFRAN) injection 4 mg (4 mg Intravenous Given 09/07/16 1746)  iopamidol (ISOVUE-300) 61 % injection (30 mLs  Contrast Given 09/07/16 1944)  iopamidol (ISOVUE-300) 61 % injection 100 mL (100 mLs Intravenous Contrast Given 09/07/16 1944)     Initial Impression / Assessment and Plan / ED Course  I have reviewed the triage vital signs and the nursing notes.  Pertinent labs & imaging results that were available during my care of the patient were reviewed by me and considered in my medical decision making (see chart for details).  Clinical Course     Patient improved with pain medicine nausea medicine. Labs unremarkable. CT scan shows enteritis. Patient is sent home with nausea medicine and pain medicine and will drink plenty of fluids and follow-up with his PCP  Final Clinical Impressions(s) / ED Diagnoses   Final diagnoses:  None    New Prescriptions New Prescriptions   ONDANSETRON (ZOFRAN ODT) 4 MG DISINTEGRATING TABLET    4mg  ODT q4 hours prn nausea/vomit   TRAMADOL (ULTRAM) 50 MG TABLET    Take 1 tablet (50 mg total) by mouth every 6 (six) hours as needed.    The chart was scribed for me under my direct supervision.  I personally performed the history, physical, and medical decision making and all procedures in the evaluation of this patient.Milton Ferguson, MD 09/07/16 2051    Milton Ferguson, MD 09/15/16 1012

## 2016-09-07 NOTE — Discharge Instructions (Signed)
Follow up with your family md next week if not improving °

## 2016-09-07 NOTE — ED Notes (Signed)
Family at bedside. 

## 2018-03-19 ENCOUNTER — Emergency Department (HOSPITAL_COMMUNITY)
Admission: EM | Admit: 2018-03-19 | Discharge: 2018-03-20 | Disposition: A | Discharge status: Home / Self Care | Payer: Self-pay | Attending: Emergency Medicine | Admitting: Emergency Medicine

## 2018-03-19 ENCOUNTER — Encounter (HOSPITAL_COMMUNITY): Payer: Self-pay

## 2018-03-19 DIAGNOSIS — Z5321 Procedure and treatment not carried out due to patient leaving prior to being seen by health care provider: Secondary | ICD-10-CM | POA: Insufficient documentation

## 2018-03-19 DIAGNOSIS — K0889 Other specified disorders of teeth and supporting structures: Secondary | ICD-10-CM | POA: Insufficient documentation

## 2018-03-19 NOTE — ED Triage Notes (Signed)
Pt reports crown breaking on the L upper side a few months ago, today pain is unbearable.

## 2018-03-20 NOTE — ED Notes (Signed)
Advised patient to stay but wanted to leave and go to dentist tomorrow.

## 2018-03-21 NOTE — ED Notes (Signed)
Follow up call made  No answer  03/21/18  12 46  s Massiah Longanecker rn

## 2021-10-11 ENCOUNTER — Other Ambulatory Visit: Payer: Self-pay

## 2021-10-11 ENCOUNTER — Encounter (HOSPITAL_COMMUNITY): Payer: Self-pay

## 2021-10-11 ENCOUNTER — Emergency Department (HOSPITAL_COMMUNITY)
Admission: EM | Admit: 2021-10-11 | Discharge: 2021-10-11 | Disposition: A | Discharge status: Home / Self Care | Payer: Self-pay | Attending: Emergency Medicine | Admitting: Emergency Medicine

## 2021-10-11 DIAGNOSIS — M545 Low back pain, unspecified: Secondary | ICD-10-CM | POA: Insufficient documentation

## 2021-10-11 HISTORY — DX: Unspecified osteoarthritis, unspecified site: M19.90

## 2021-10-11 MED ORDER — CYCLOBENZAPRINE HCL 10 MG PO TABS: 10.0000 mg | ORAL_TABLET | Freq: Two times a day (BID) | ORAL | 0 refills | Status: DC | PRN

## 2021-10-11 MED ORDER — PREDNISONE 10 MG PO TABS: 30.0000 mg | ORAL_TABLET | Freq: Every day | ORAL | 0 refills | Status: AC

## 2021-10-11 NOTE — Discharge Instructions (Signed)
You have been seen here for back pain. I recommend taking over-the-counter pain medications like ibuprofen and/or Tylenol every 6 as needed.  Please follow dosage and on the back of bottle.  I also recommend applying heat to the area and stretching out the muscles as this will help decrease stiffness and pain.  I have given you information on exercises please follow.  Prednisone and a muscle relaxer  Follow-up with PCP as needed  Come back to the emergency department if you develop chest pain, shortness of breath, severe abdominal pain, uncontrolled nausea, vomiting, diarrhea.

## 2021-10-11 NOTE — ED Triage Notes (Signed)
Patient complaining of lumbar pain for the past three days. States that he has RA in back. Feels like a pulled muscle/pinched nerve.

## 2021-10-11 NOTE — ED Provider Notes (Signed)
[facility] Provider Note   CSN: [id number] Arrival date & time: 10/11/21  1157     History  Chief Complaint  Patient presents with   Back Pain    Jeffery Mckee is a 46 y.o. male.  HPI  Patient with medical history including arthritis presents to the emergency department with complaints of lower back pain.  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  He said pain radiates across his lower back, pain is worsened with movement improved with rest, he denies any paresthesia or weakness moving down his legs, no saddle paresthesias no urinary incontinency retention difficult bowel movements, he denies any trauma to the area, no associated fevers chills chest pain shortness of breath peripheral edema.  States that he been taking over-the-counter pain medication not much relief, he states that he has had this in the pass, will go on steroids and a few days off and this will improve on its own.  There is no associated urinary symptoms no stomach pain no flank tenderness.  Home Medications Prior to Admission medications   Medication Sig Start Date End Date Taking? Authorizing Provider  cyclobenzaprine (FLEXERIL) 10 MG tablet Take 1 tablet (10 mg total) by mouth 2 (two) times daily as needed for muscle spasms. 10/11/21  Yes Marcello Fennel, PA-C  predniSONE (DELTASONE) 10 MG tablet Take 3 tablets (30 mg total) by mouth daily for 5 days. 10/11/21 10/16/21 Yes Marcello Fennel, PA-C  esomeprazole (NEXIUM) 20 MG capsule Take 20 mg by mouth daily at 12 noon.    [provider]  HYDROcodone-acetaminophen (NORCO/VICODIN) 5-325 MG tablet 1 or 2 tabs PO q6 hours prn pain Patient taking differently: Take 1-2 tablets by mouth every 6 (six) hours as needed for moderate pain or severe pain.  03/17/16   Francine Graven, DO  HYDROmorphone (DILAUDID) 4 MG tablet  Take 1 tablet (4 mg total) by mouth every 4 (four) hours as needed for severe pain. 06/16/16   Milton Ferguson, MD  ibuprofen (ADVIL,MOTRIN) 600 MG tablet Take 1 tablet (600 mg total) by mouth every 6 (six) hours as needed. Patient not taking: Reported on 09/07/2016 06/06/16   Triplett, Tammy, PA-C  omeprazole (PRILOSEC) 20 MG capsule Take 1 capsule (20 mg total) by mouth daily. Patient not taking: Reported on 03/17/2016 02/13/16   Elnora Morrison, MD  ondansetron (ZOFRAN ODT) 4 MG disintegrating tablet 4mg  ODT q4 hours prn nausea/vomit 09/07/16   Milton Ferguson, MD  ondansetron (ZOFRAN) 4 MG tablet Take 1 tablet (4 mg total) by mouth every 6 (six) hours. 06/06/16   Triplett, Tammy, PA-C  oxyCODONE-acetaminophen (PERCOCET/ROXICET) 5-325 MG tablet Take 1-2 tablets by mouth every 4 (four) hours as needed for severe pain. Patient not taking: Reported on 09/07/2016 06/17/16   Lily Kocher, PA-C  traMADol (ULTRAM) 50 MG tablet Take 1 tablet (50 mg total) by mouth every 6 (six) hours as needed. 09/07/16   Milton Ferguson, MD      Allergies    Morphine and related    Review of Systems   Review of Systems  Constitutional:  Negative for chills and fever.  Respiratory:  Negative for shortness of breath.   Cardiovascular:  Negative for chest pain.  Gastrointestinal:  Negative for abdominal pain.  Musculoskeletal:  Positive for back pain.  Neurological:  Negative for headaches.   Physical Exam Updated  Vital Signs BP 117/81    Pulse 78    Temp 98.7 F (37.1 C) (Oral)    Resp 18    Ht 5\' 6"  (1.676 m)    Wt 113.4 kg    SpO2 98%    BMI 40.35 kg/m  Physical Exam Vitals and nursing note reviewed.  Constitutional:      General: He is not in acute distress.    Appearance: He is not ill-appearing.  HENT:     Head: Normocephalic and atraumatic.     Nose: No congestion.  Eyes:     Conjunctiva/sclera: Conjunctivae normal.  Cardiovascular:     Rate and Rhythm: Normal rate and regular rhythm.     Pulses: Normal  pulses.     Heart sounds: No murmur heard.   No friction rub. No gallop.  Pulmonary:     Effort: No respiratory distress.     Breath sounds: No wheezing, rhonchi or rales.  Musculoskeletal:     Comments: Spine was palpated nontender to palpation no step-off deformities noted, no overlying skin changes, he had noted tenderness along his paraspinal muscles, it was focal pain was reproducible, he has 5-5 strength neurovascularly intact her lower extremities able to ambulate without difficulty.  Skin:    General: Skin is warm and dry.  Neurological:     Mental Status: He is alert.  Psychiatric:        Mood and Affect: Mood normal.    ED Results / Procedures / Treatments   Labs (all labs ordered are listed, but only abnormal results are displayed) Labs Reviewed - No data to display  EKG None  Radiology No results found.  Procedures Procedures    Medications Ordered in ED Medications - No data to display  ED Course/ Medical Decision Making/ A&P                           Medical Decision Making  This patient presents to the ED for concern of back pain, this involves an extensive number of treatment options, and is a complaint that carries with it a high risk of complications and morbidity.  The differential diagnosis includes dissection, spine equina kidney stone    Additional history obtained:  Additional history obtained from N/A  Co morbidities that complicate the patient evaluation  N/A  Social Determinants of Health:  N/A    Lab Tests:  I Ordered, and personally interpreted labs.  The pertinent results include:  N/A   Imaging Studies ordered:  I ordered imaging studies including N/A    Test Considered:   will defer imaging at this time no traumatic injury associate with this pain, not increased risk for pathological fractures no osteoporosis low suspicion for fractures at this time.    Rule out I have low suspicion for spinal fracture or spinal  cord abnormality as patient denies urinary incontinency, retention, difficulty with bowel movements, denies saddle paresthesias.  Spine was palpated there is no step-off, crepitus or gross deformities felt, patient had 5/5 strength, full range of motion, neurovascular fully intact in the lower extremities.  Low suspicion for septic arthritis as patient denies IV drug use, skin exam was performed no erythematous, edema or warm joints noted.  Low suspicion  for UTI [place] kidney stone no associated urinary symptoms no CVA tenderness.  Low suspicion for dissection atypical pain is reproducible focal worsen with movement     Dispostion and problem list  After consideration of  the diagnostic results and the patients response to treatment, I feel that the patent would benefit from discharge.  Back pain-likely muscular strain started on steroids, muscle relaxers recommend over-the-counter pain medication follow-up PCP as needed gait strict return precautions            Final Clinical Impression(s) / ED Diagnoses Final diagnoses:  Acute bilateral low back pain without sciatica    Rx / DC Orders ED Discharge Orders          Ordered    predniSONE (DELTASONE) 10 MG tablet  Daily        10/11/21 1457    cyclobenzaprine (FLEXERIL) 10 MG tablet  2 times daily PRN        10/11/21 1457              Marcello Fennel, PA-C 10/11/21 1459    Milton Ferguson, MD 10/12/21 1753

## 2021-12-20 ENCOUNTER — Emergency Department (HOSPITAL_COMMUNITY)
Admission: EM | Admit: 2021-12-20 | Discharge: 2021-12-21 | Disposition: A | Discharge status: Home / Self Care | Payer: Self-pay | Attending: Emergency Medicine | Admitting: Emergency Medicine

## 2021-12-20 ENCOUNTER — Emergency Department (HOSPITAL_COMMUNITY): Payer: Self-pay

## 2021-12-20 ENCOUNTER — Encounter (HOSPITAL_COMMUNITY): Payer: Self-pay | Admitting: Emergency Medicine

## 2021-12-20 ENCOUNTER — Other Ambulatory Visit: Payer: Self-pay

## 2021-12-20 DIAGNOSIS — N201 Calculus of ureter: Secondary | ICD-10-CM | POA: Insufficient documentation

## 2021-12-20 LAB — COMPREHENSIVE METABOLIC PANEL
ALT: 19 U/L (ref 0–44)
AST: 21 U/L (ref 15–41)
Albumin: 4.2 g/dL (ref 3.5–5.0)
Alkaline Phosphatase: 100 U/L (ref 38–126)
Anion gap: 9 (ref 5–15)
BUN: 23 mg/dL — ABNORMAL HIGH (ref 6–20)
CO2: 24 mmol/L (ref 22–32)
Calcium: 9 mg/dL (ref 8.9–10.3)
Chloride: 102 mmol/L (ref 98–111)
Creatinine, Ser: 1.22 mg/dL (ref 0.61–1.24)
GFR, Estimated: >60 mL/min (ref >60)
Glucose, Bld: 108 mg/dL — ABNORMAL HIGH (ref 70–99)
Potassium: 3.4 mmol/L — ABNORMAL LOW (ref 3.5–5.1)
Sodium: 135 mmol/L (ref 135–145)
Total Bilirubin: 0.6 mg/dL (ref 0.3–1.2)
Total Protein: 8.2 g/dL — ABNORMAL HIGH (ref 6.5–8.1)

## 2021-12-20 LAB — CBC
HCT: 44.7 % (ref 39.0–52.0)
Hemoglobin: 15.2 g/dL (ref 13.0–17.0)
MCH: 31.8 pg (ref 26.0–34.0)
MCHC: 34 g/dL (ref 30.0–36.0)
MCV: 93.5 fL (ref 80.0–100.0)
Platelets: 340 10*3/uL (ref 150–400)
RBC: 4.78 MIL/uL (ref 4.22–5.81)
RDW: 12.6 % (ref 11.5–15.5)
WBC: 16.2 10*3/uL — ABNORMAL HIGH (ref 4.0–10.5)
nRBC: 0 % (ref 0.0–0.2)

## 2021-12-20 LAB — LIPASE, BLOOD: Lipase: 19 U/L (ref 11–51)

## 2021-12-20 MED ORDER — SODIUM CHLORIDE 0.9 % IV BOLUS
500.0000 mL | Freq: Once | INTRAVENOUS | Status: AC
  Administered 2021-12-20: 500 mL via INTRAVENOUS

## 2021-12-20 MED ORDER — ONDANSETRON HCL 4 MG/2ML IJ SOLN
4.0000 mg | Freq: Once | INTRAMUSCULAR | Status: AC
Start: 2021-12-20 — End: 2021-12-20
  Administered 2021-12-20: 4 mg via INTRAVENOUS
  Filled 2021-12-20: qty 2

## 2021-12-20 MED ORDER — HYDROMORPHONE HCL 1 MG/ML IJ SOLN
1.0000 mg | Freq: Once | INTRAMUSCULAR | Status: AC
  Administered 2021-12-20: 1 mg via INTRAVENOUS
  Filled 2021-12-20: qty 1

## 2021-12-20 MED ORDER — IOHEXOL 300 MG/ML  SOLN
100.0000 mL | Freq: Once | INTRAMUSCULAR | Status: AC | PRN
  Administered 2021-12-21: 100 mL via INTRAVENOUS

## 2021-12-20 NOTE — ED Triage Notes (Signed)
Pt c/o generalized abd pain that radiates to the left flank and groin. Pt states he had normal bowel movement today.  ?

## 2021-12-20 NOTE — ED Provider Notes (Signed)
?[facility] ?Provider Note ? ? ?CSN: [id number] ?Arrival date & time: 12/20/21  1909 ? ?  ? ?History ? ?Chief Complaint  ?Patient presents with  ? Abdominal Pain  ? ? ?Jeffery Mckee is a 46 y.o. male. ? ?Patient presents to the emergency department for evaluation of abdominal pain.  Patient reports that the symptoms have been intermittent for 1 week.  Today, however, pain intensified and has been continuous.  Pain is from the testicles up through the upper abdomen.  When the pain intensified today he started having nausea and vomiting.  Patient reports that he has a history of urinary tract infections, took AZO without improvement. ? ? ? ? ?  ? ?Home Medications ?Prior to Admission medications   ?Medication Sig Start Date End Date Taking? Authorizing Provider  ?cephALEXin (KEFLEX) 500 MG capsule Take 1 capsule (500 mg total) by mouth 2 (two) times daily. 12/21/21  Yes Emonii Wienke, Gwenyth Allegra, MD  ?oxyCODONE-acetaminophen (PERCOCET) 5-325 MG tablet Take 1-2 tablets by mouth every 4 (four) hours as needed. 12/21/21  Yes Gracynn Rajewski, Gwenyth Allegra, MD  ?oxyCODONE-acetaminophen (PERCOCET/ROXICET) 5-325 MG tablet Take 1 tablet by mouth every 6 (six) hours as needed for severe pain. 12/21/21  Yes Jaquae Rieves, Gwenyth Allegra, MD  ?tamsulosin (FLOMAX) 0.4 MG CAPS capsule Take 1 capsule (0.4 mg total) by mouth daily. 12/21/21  Yes Dahiana Kulak, Gwenyth Allegra, MD  ?esomeprazole (NEXIUM) 20 MG capsule Take 20 mg by mouth daily at 12 noon.    [provider]  ?   ? ?Allergies    ?Morphine and related   ? ?Review of Systems   ?Review of Systems  ?Gastrointestinal:  Positive for abdominal pain, nausea and vomiting.  ? ?Physical Exam ?Updated Vital Signs ?BP 114/77   Pulse 65   Temp 97.8 ?F (36.6 ?C) (Oral)   Resp 18   Ht '5\' 6"'$  (1.676 m)   Wt 114 kg   SpO2 96%   BMI 40.56 kg/m?  ?Physical Exam ?Vitals and nursing note reviewed.  ?Constitutional:   ?   General: He is not in acute distress. ?   Appearance: He is  well-developed.  ?HENT:  ?   Head: Normocephalic and atraumatic.  ?   Mouth/Throat:  ?   Mouth: Mucous membranes are moist.  ?Eyes:  ?   General: Vision grossly intact. Gaze aligned appropriately.  ?   Extraocular Movements: Extraocular movements intact.  ?   Conjunctiva/sclera: Conjunctivae normal.  ?Cardiovascular:  ?   Rate and Rhythm: Normal rate and regular rhythm.  ?   Pulses: Normal pulses.  ?   Heart sounds: Normal heart sounds, S1 normal and S2 normal. No murmur heard. ?  No friction rub. No gallop.  ?Pulmonary:  ?   Effort: Pulmonary effort is normal. No respiratory distress.  ?   Breath sounds: Normal breath sounds.  ?Abdominal:  ?   Palpations: Abdomen is soft.  ?   Tenderness: There is generalized abdominal tenderness. There is no guarding or rebound.  ?   Hernia: No hernia is present.  ?Musculoskeletal:     ?   General: No swelling.  ?   Cervical back: Full passive range of motion without pain, normal range of motion and neck supple. No pain with movement, spinous process tenderness or muscular tenderness. Normal range of motion.  ?   Right lower leg: No edema.  ?   Left lower leg: No edema.  ?Skin: ?   General: Skin is warm and dry.  ?  Capillary Refill: Capillary refill takes less than 2 seconds.  ?   Findings: No ecchymosis, erythema, lesion or wound.  ?Neurological:  ?   Mental Status: He is alert and oriented to person, place, and time.  ?   GCS: GCS eye subscore is 4. GCS verbal subscore is 5. GCS motor subscore is 6.  ?   Cranial Nerves: Cranial nerves 2-12 are intact.  ?   Sensory: Sensation is intact.  ?   Motor: Motor function is intact. No weakness or abnormal muscle tone.  ?   Coordination: Coordination is intact.  ?Psychiatric:     ?   Mood and Affect: Mood normal.     ?   Speech: Speech normal.     ?   Behavior: Behavior normal.  ? ? ?ED Results / Procedures / Treatments   ?Labs ?(all labs ordered are listed, but only abnormal results are displayed) ?Labs Reviewed  ?COMPREHENSIVE  METABOLIC PANEL - Abnormal; Notable for the following components:  ?    Result Value  ? Potassium 3.4 (*)   ? Glucose, Bld 108 (*)   ? BUN 23 (*)   ? Total Protein 8.2 (*)   ? All other components within normal limits  ?CBC - Abnormal; Notable for the following components:  ? WBC 16.2 (*)   ? All other components within normal limits  ?URINALYSIS, ROUTINE W REFLEX MICROSCOPIC - Abnormal; Notable for the following components:  ? Color, Urine AMBER (*)   ? Hgb urine dipstick SMALL (*)   ? Nitrite POSITIVE (*)   ? Bacteria, UA RARE (*)   ? All other components within normal limits  ?LIPASE, BLOOD  ? ? ?EKG ?None ? ?Radiology ?CT ABDOMEN PELVIS W CONTRAST ? ?Result Date: 12/21/2021 ?CLINICAL DATA:  Left flank pain. EXAM: CT ABDOMEN AND PELVIS WITH CONTRAST TECHNIQUE: Multidetector CT imaging of the abdomen and pelvis was performed using the standard protocol following bolus administration of intravenous contrast. RADIATION DOSE REDUCTION: This exam was performed according to the departmental dose-optimization program which includes automated exposure control, adjustment of the mA and/or kV according to patient size and/or use of iterative reconstruction technique. CONTRAST:  11m OMNIPAQUE IOHEXOL 300 MG/ML  SOLN COMPARISON:  CT with contrast 09/07/2016. FINDINGS: Lower chest: No acute abnormality. Hepatobiliary: There are few scattered tiny hypodensities in the liver substance which too small to characterize but which are unchanged. There has been interval cholecystectomy no biliary dilatation is seen. 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 No other focal abnormality, no ductal dilatation. Spleen: Unremarkable.  No splenomegaly. Adrenals/Urinary Tract: There is no adrenal mass. There is a 5 cm medial upper pole right renal cyst which was previously 3 cm, small 1 cm cyst is posterior to this which was previously 5 mm. No suspicious renal cortical lesion is seen. On the left there is delayed cortical  contrast excretion including in the delayed phase and mild hydroureteronephrosis to the pelvic inlet where there is a 3 x 3 x 4 mm distal ureteral stone 5.5 cm from the UVJ. Both ureters are otherwise clear. There are no intrarenal stones either side. There is mild left peripelvic and periureteral stranding but no significant perinephric fluid, with asymmetric left perirenal stranding. There is mild bladder thickening versus nondistention. Stomach/Bowel: There is fold thickening in the stomach and jejunum, mild dilatation of some of the thickened jejunal segments up to 3 cm without an abrupt transition. Relatively decompressed caliber in the remaining small bowel some  of which contains contrast. The appendix is normal. There are scattered colonic diverticula without evidence of diverticulitis. Vascular/Lymphatic: There is mild-to-moderate aortoiliac calcification without evidence of aneurysm or adenopathy. Reproductive: Normal prostate size and configuration. Other: There is a complex moderate-sized umbilical fat hernia, larger than previously without entrapped bowel. There are small inguinal fat hernias with evidence of prior left inguinal hernia repair again shown. There is no free air, hemorrhage or fluid Musculoskeletal: There are multilevel degenerative disc changes in the lumbar spine with no worrisome regional bone lesion. IMPRESSION: 1. 3 x 3 x 4 mm left ureteral stone 5.5 cm from the UVJ with mild obstructive uropathy. Correlate clinically for infectious complication. 2. No appreciable intrarenal stones.  Right renal cysts. 3. Imaging findings of gastroenteritis with mild dilatation of left upper abdominal jejunal segments without abrupt transition. Findings could be due to ileus related to the enteritis, or could be a low-grade obstruction developing. Rest of the small bowel is relatively collapsed. There are no mesenteric inflammatory changes. 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 5. Stable tiny too  small to characterize hepatic hypodensities. 6. Diverticulosis without evidence of diverticulitis. 7. Mild bladder wall prominence versus underdistention. 8. Complex umbilical fat hernia. Small inguinal fat hernias

## 2021-12-21 ENCOUNTER — Telehealth (HOSPITAL_COMMUNITY): Payer: Self-pay | Admitting: Emergency Medicine

## 2021-12-21 LAB — URINALYSIS, ROUTINE W REFLEX MICROSCOPIC
Bilirubin Urine: NEGATIVE
Glucose, UA: NEGATIVE mg/dL
Ketones, ur: NEGATIVE mg/dL
Leukocytes,Ua: NEGATIVE
Nitrite: POSITIVE — AB
Protein, ur: NEGATIVE mg/dL
Specific Gravity, Urine: 1.008 (ref 1.005–1.030)
pH: 6 (ref 5.0–8.0)

## 2021-12-21 MED ORDER — TAMSULOSIN HCL 0.4 MG PO CAPS: 0.4000 mg | ORAL_CAPSULE | Freq: Every day | ORAL | 0 refills | Status: DC

## 2021-12-21 MED ORDER — OXYCODONE-ACETAMINOPHEN 5-325 MG PO TABS: 1.0000 | ORAL_TABLET | Freq: Four times a day (QID) | ORAL | 0 refills | Status: DC | PRN

## 2021-12-21 MED ORDER — HYDROMORPHONE HCL 1 MG/ML IJ SOLN
1.0000 mg | Freq: Once | INTRAMUSCULAR | Status: AC
  Administered 2021-12-21: 1 mg via INTRAVENOUS
  Filled 2021-12-21: qty 1

## 2021-12-21 MED ORDER — OXYCODONE-ACETAMINOPHEN 5-325 MG PO TABS
1.0000 | ORAL_TABLET | ORAL | 0 refills | Status: DC | PRN
Start: 2021-12-21 — End: 2021-12-21

## 2021-12-21 MED ORDER — SODIUM CHLORIDE 0.9 % IV SOLN
1.0000 g | Freq: Once | INTRAVENOUS | Status: AC
  Administered 2021-12-21: 1 g via INTRAVENOUS
  Filled 2021-12-21: qty 10

## 2021-12-21 MED ORDER — KETOROLAC TROMETHAMINE 30 MG/ML IJ SOLN
15.0000 mg | Freq: Once | INTRAMUSCULAR | Status: AC
  Administered 2021-12-21: 15 mg via INTRAVENOUS
  Filled 2021-12-21: qty 1

## 2021-12-21 MED ORDER — CEPHALEXIN 500 MG PO CAPS: 500.0000 mg | ORAL_CAPSULE | Freq: Two times a day (BID) | ORAL | 0 refills | Status: DC

## 2021-12-21 NOTE — Telephone Encounter (Signed)
Called by [facility] in [place]. Will need to re-send Rx for percocet.  ?

## 2021-12-21 NOTE — Discharge Instructions (Signed)
Return to the ED if you develop fever, uncontrolled pain, nausea and vomiting causing you to be unable to take your medicine. ?

## 2021-12-22 MED FILL — Oxycodone w/ Acetaminophen Tab 5-325 MG: ORAL | Qty: 6 | Status: AC

## 2021-12-29 ENCOUNTER — Ambulatory Visit (INDEPENDENT_AMBULATORY_CARE_PROVIDER_SITE_OTHER): Payer: Self-pay | Admitting: Urology

## 2021-12-29 ENCOUNTER — Encounter: Payer: Self-pay | Admitting: Urology

## 2021-12-29 VITALS — BP 125/80 | HR 65

## 2021-12-29 DIAGNOSIS — N281 Cyst of kidney, acquired: Secondary | ICD-10-CM

## 2021-12-29 DIAGNOSIS — N201 Calculus of ureter: Secondary | ICD-10-CM | POA: Insufficient documentation

## 2021-12-29 LAB — MICROSCOPIC EXAMINATION
Epithelial Cells (non renal): NONE SEEN /hpf (ref 0–10)
Renal Epithel, UA: NONE SEEN /hpf
WBC, UA: NONE SEEN /hpf (ref 0–5)

## 2021-12-29 LAB — URINALYSIS, ROUTINE W REFLEX MICROSCOPIC
Bilirubin, UA: NEGATIVE
Glucose, UA: NEGATIVE
Ketones, UA: NEGATIVE
Leukocytes,UA: NEGATIVE
Nitrite, UA: NEGATIVE
Protein,UA: NEGATIVE
Specific Gravity, UA: 1.025 (ref 1.005–1.030)
Urobilinogen, Ur: 0.2 mg/dL (ref 0.2–1.0)
pH, UA: 5.5 (ref 5.0–7.5)

## 2021-12-29 MED ORDER — TAMSULOSIN HCL 0.4 MG PO CAPS: 0.4000 mg | ORAL_CAPSULE | Freq: Every day | ORAL | 1 refills | Status: DC

## 2021-12-29 MED ORDER — OXYCODONE-ACETAMINOPHEN 5-325 MG PO TABS: 1.0000 | ORAL_TABLET | Freq: Four times a day (QID) | ORAL | 0 refills | Status: DC | PRN

## 2021-12-29 NOTE — Progress Notes (Signed)
? ?Assessment: ?1. Ureteral calculus   ?2. Bilateral renal cysts   ? ? ?Plan: ?I personally reviewed the CT study from 12/21/2021 showing a distal left ureteral calculus with associated obstruction. ?Diagnosis and management of a distal ureteral calculus discussed including spontaneous passage, shockwave lithotripsy, and ureteroscopic stone manipulation.  Given the stone size, I advised him that he has a >90% chance of spontaneous passage.  Following our discussion, he would like to continue to try to pass the stone spontaneously. ?Continue tamsulosin 0.4 mg for medical expulsive therapy.  Off label use discussed. ?Pain medication as needed. ?Strain urine. ?Stone prevention discussed and information provided. ?Return to office in 7-10 days with KUB. ? ?Chief Complaint:  ?Chief Complaint  ?Patient presents with  ? ureteral calculus  ? ? ?History of Present Illness: ? ?Jeffery Mckee is a 46 y.o. year old male who is seen in consultation from Joseph Berkshire, MD for evaluation of a left ureteral calculus.  He presented to the emergency room on 12/21/2021 with abdominal pain and associated nausea and vomiting.  CT imaging demonstrated bilateral renal cysts, 3 x 3 x 4 mm left distal ureteral stone with associated obstruction on the left.  WBC 16.2.  Creatinine 1.22.  Urinalysis showed pH 6.0, 0-5 RBCs, 0-5 WBCs, rare bacteria, nitrite positive.  No urine culture obtained. ?He was treated with pain medication and Flomax. ?His pain has been fairly well controlled.  He has taken pain medication on 3-4 occasions in the past week.  He did take some pain medication this morning.  No further nausea or vomiting.  No fevers or chills.  He has completed cephalexin prescribed by the emergency room.  No dysuria or gross hematuria. ?No prior stone episodes. ? ?Past Medical History:  ?Past Medical History:  ?Diagnosis Date  ? Arthritis   ? GERD (gastroesophageal reflux disease)   ? Nephrolithiasis   ? ? ?Past Surgical History:   ?Past Surgical History:  ?Procedure Laterality Date  ? CHOLECYSTECTOMY    ? HERNIA REPAIR    ? ? ?Allergies:  ?Allergies  ?Allergen Reactions  ? Penicillins Nausea Only  ? Morphine And Related Nausea Only  ? ? ?Family History:  ?Family History  ?Problem Relation Age of Onset  ? Hypertension Mother   ? Osteoarthritis Mother   ? Kidney Stones Mother   ? Diabetes Mother   ? Cancer Father   ? ? ?Social History:  ?Social History  ? ?Tobacco Use  ? Smoking status: Every Day  ?  Packs/day: 1.50  ?  Types: Cigarettes  ? Smokeless tobacco: Never  ?Substance Use Topics  ? Alcohol use: No  ? Drug use: No  ? ? ?Review of symptoms:  ?Constitutional:  Negative for unexplained weight loss, night sweats, fever, chills ?ENT:  Negative for nose bleeds, sinus pain, painful swallowing ?CV:  Negative for chest pain, shortness of breath, exercise intolerance, palpitations, loss of consciousness ?Resp:  Negative for cough, wheezing, shortness of breath ?GI:  Negative for nausea, vomiting, diarrhea, bloody stools ?GU:  Positives noted in HPI; otherwise negative for gross hematuria, dysuria, urinary incontinence ?Neuro:  Negative for seizures, poor balance, limb weakness, slurred speech ?Psych:  Negative for lack of energy, depression, anxiety ?Endocrine:  Negative for polydipsia, polyuria, symptoms of hypoglycemia (dizziness, hunger, sweating) ?Hematologic:  Negative for anemia, purpura, petechia, prolonged or excessive bleeding, use of anticoagulants  ?Allergic:  Negative for difficulty breathing or choking as a result of exposure to anything; no shellfish allergy; no allergic response (  rash/itch) to materials, foods ? ?Physical exam: ?BP 125/80   Pulse 65  ?GENERAL APPEARANCE:  Well appearing, well developed, well nourished, NAD ?HEENT: Atraumatic, Normocephalic, oropharynx clear. ?NECK: Supple without lymphadenopathy or thyromegaly. ?LUNGS: Clear to auscultation bilaterally. ?HEART: Regular Rate and Rhythm without murmurs, gallops, or  rubs. ?ABDOMEN: Soft, non-tender, No Masses. ?EXTREMITIES: Moves all extremities well.  Without clubbing, cyanosis, or edema. ?NEUROLOGIC:  Alert and oriented x 3, normal gait, CN II-XII grossly intact.  ?MENTAL STATUS:  Appropriate. ?BACK:  Non-tender to palpation.  No CVAT ?SKIN:  Warm, dry and intact.   ? ?Results: ?U/A:  pH 5.5, 0-2 RBC, few bacteria, nitrite negative ? ?

## 2022-01-02 ENCOUNTER — Telehealth: Payer: Self-pay

## 2022-01-02 NOTE — Telephone Encounter (Signed)
Patient called wanting to know if he can take the more than the recommended dosage on the prescription. He advised he is in severe pain where he is feeling nauseous.  ?

## 2022-01-02 NOTE — Telephone Encounter (Signed)
Returned patient call and notified of Dr. Felipa Eth message. Patient voiced understanding.  ?

## 2022-01-10 ENCOUNTER — Telehealth: Payer: Self-pay

## 2022-01-10 NOTE — Telephone Encounter (Signed)
Patient is out of pain medication. ?He is out because he has had to take them for severe pain. ? ?Wanting to more called in if possible ? ?Please advise. ? ?Thanks, ?Helene Kelp ?

## 2022-01-11 ENCOUNTER — Other Ambulatory Visit: Payer: Self-pay | Admitting: Urology

## 2022-01-11 DIAGNOSIS — N201 Calculus of ureter: Secondary | ICD-10-CM

## 2022-01-11 MED ORDER — OXYCODONE-ACETAMINOPHEN 5-325 MG PO TABS: 1.0000 | ORAL_TABLET | Freq: Four times a day (QID) | ORAL | 0 refills | Status: DC | PRN

## 2022-01-11 NOTE — Telephone Encounter (Signed)
Patient aware via voicemail 

## 2022-01-19 ENCOUNTER — Encounter: Payer: Self-pay | Admitting: Urology

## 2022-01-19 ENCOUNTER — Ambulatory Visit: Payer: 59 | Admitting: Urology

## 2022-01-19 ENCOUNTER — Ambulatory Visit (HOSPITAL_COMMUNITY)
Admission: RE | Admit: 2022-01-19 | Discharge: 2022-01-19 | Disposition: A | Discharge status: Home / Self Care | Payer: 59 | Source: Ambulatory Visit | Attending: Urology | Admitting: Urology

## 2022-01-19 VITALS — BP 139/94 | HR 74

## 2022-01-19 DIAGNOSIS — N281 Cyst of kidney, acquired: Secondary | ICD-10-CM | POA: Diagnosis not present

## 2022-01-19 DIAGNOSIS — N201 Calculus of ureter: Secondary | ICD-10-CM | POA: Diagnosis not present

## 2022-01-19 LAB — URINALYSIS, ROUTINE W REFLEX MICROSCOPIC
Bilirubin, UA: NEGATIVE
Glucose, UA: NEGATIVE
Ketones, UA: NEGATIVE
Leukocytes,UA: NEGATIVE
Nitrite, UA: NEGATIVE
Specific Gravity, UA: >1.03 — ABNORMAL HIGH (ref 1.005–1.030)
Urobilinogen, Ur: 0.2 mg/dL (ref 0.2–1.0)
pH, UA: 6 (ref 5.0–7.5)

## 2022-01-19 LAB — MICROSCOPIC EXAMINATION
Renal Epithel, UA: NONE SEEN /hpf
WBC, UA: NONE SEEN /hpf (ref 0–5)

## 2022-01-19 MED ORDER — TAMSULOSIN HCL 0.4 MG PO CAPS: 0.4000 mg | ORAL_CAPSULE | Freq: Every day | ORAL | 1 refills | Status: DC

## 2022-01-19 NOTE — Progress Notes (Signed)
Assessment: 1. Ureteral calculus, left, distal   2. Bilateral renal cysts      Plan: I reviewed the KUB from today.  No obvious calcification seen in the area of the left distal ureter. I discussed these findings with the patient today.  Given his symptoms, I think it is likely that the stone has moved distally and is near the UVJ. Continue tamsulosin 0.4 mg for medical expulsive therapy.   Pain medication as needed. Continue to strain urine. Return to office in 10-14 days.  Chief Complaint:  Chief Complaint  Patient presents with   ureteral calculus    History of Present Illness:  Jeffery Mckee is a 46 y.o. year old male who is seen for further evaluation of a left ureteral calculus.  He presented to the emergency room on 12/21/2021 with abdominal pain and associated nausea and vomiting.  CT imaging demonstrated bilateral renal cysts, 3 x 3 x 4 mm left distal ureteral stone with associated obstruction on the left.  WBC 16.2.  Creatinine 1.22.  Urinalysis showed pH 6.0, 0-5 RBCs, 0-5 WBCs, rare bacteria, nitrite positive.  No urine culture obtained. He was treated with pain medication and Flomax. At his initial visit on 12/29/21, he reported his pain had been fairly well controlled.  He had taken pain medication on 3-4 occasions in the prior week.  No further nausea or vomiting.  No fevers or chills.  He completed cephalexin prescribed by the emergency room.  No dysuria or gross hematuria. No prior stone episodes.  He returns today for follow-up.  He has been straining his urine and is not aware of passing a stone.  He has not required any pain medication for the past week.  He has experienced increased frequency, urgency, and some dysuria.  No gross hematuria.  No fevers, chills, nausea, or vomiting.  He recently ran out of the tamsulosin.  Portions of the above documentation were copied from a prior visit for review purposes only.   Past Medical History:  Past Medical  History:  Diagnosis Date   Arthritis    GERD (gastroesophageal reflux disease)    Nephrolithiasis     Past Surgical History:  Past Surgical History:  Procedure Laterality Date   CHOLECYSTECTOMY     HERNIA REPAIR      Allergies:  Allergies  Allergen Reactions   Penicillins Nausea Only   Morphine And Related Nausea Only    Family History:  Family History  Problem Relation Age of Onset   Hypertension Mother    Osteoarthritis Mother    Kidney Stones Mother    Diabetes Mother    Cancer Father     Social History:  Social History   Tobacco Use   Smoking status: Every Day    Packs/day: 1.50    Types: Cigarettes   Smokeless tobacco: Never  Substance Use Topics   Alcohol use: No   Drug use: No    ROS: Constitutional:  Negative for fever, chills, weight loss CV: Negative for chest pain, previous MI, hypertension Respiratory:  Negative for shortness of breath, wheezing, sleep apnea, frequent cough GI:  Negative for nausea, vomiting, bloody stool, GERD  Physical exam: BP (!) 139/94   Pulse 74  GENERAL APPEARANCE:  Well appearing, well developed, well nourished, NAD HEENT:  Atraumatic, normocephalic, oropharynx clear NECK:  Supple without lymphadenopathy or thyromegaly ABDOMEN:  Soft, non-tender, no masses EXTREMITIES:  Moves all extremities well, without clubbing, cyanosis, or edema NEUROLOGIC:  Alert and oriented x  3, normal gait, CN II-XII grossly intact MENTAL STATUS:  appropriate BACK:  Non-tender to palpation, No CVAT SKIN:  Warm, dry, and intact  Results: U/A: 0-2 RBCs, few bacteria

## 2022-02-02 ENCOUNTER — Encounter: Payer: Self-pay | Admitting: Urology

## 2022-02-02 ENCOUNTER — Ambulatory Visit: Payer: 59 | Admitting: Urology

## 2022-02-02 VITALS — BP 138/90 | HR 70 | Ht 66.0 in | Wt 251.0 lb

## 2022-02-02 DIAGNOSIS — N201 Calculus of ureter: Secondary | ICD-10-CM | POA: Diagnosis not present

## 2022-02-02 DIAGNOSIS — N281 Cyst of kidney, acquired: Secondary | ICD-10-CM | POA: Diagnosis not present

## 2022-02-02 LAB — URINALYSIS, ROUTINE W REFLEX MICROSCOPIC
Bilirubin, UA: NEGATIVE
Glucose, UA: NEGATIVE
Ketones, UA: NEGATIVE
Leukocytes,UA: NEGATIVE
Nitrite, UA: NEGATIVE
RBC, UA: NEGATIVE
Specific Gravity, UA: >1.03 — ABNORMAL HIGH (ref 1.005–1.030)
Urobilinogen, Ur: 1 mg/dL (ref 0.2–1.0)
pH, UA: 6 (ref 5.0–7.5)

## 2022-02-02 MED ORDER — OXYCODONE-ACETAMINOPHEN 5-325 MG PO TABS: 1.0000 | ORAL_TABLET | Freq: Four times a day (QID) | ORAL | 0 refills | Status: DC | PRN

## 2022-02-02 MED ORDER — TAMSULOSIN HCL 0.4 MG PO CAPS: 0.4000 mg | ORAL_CAPSULE | Freq: Every day | ORAL | 1 refills | Status: DC

## 2022-02-02 NOTE — Progress Notes (Signed)
Assessment: 1. Ureteral calculus, left, distal   2. Bilateral renal cysts    Plan: Continue tamsulosin 0.4 mg for medical expulsive therapy.   Pain medication as needed. Continue to strain urine. I discussed options for management of the left distal ureteral calculus.  Given the time since his initial CT on 12/21/2021, I recommended repeat imaging prior to any surgical management of the ureteral calculus. CT urogram at [facility] to evaluate stone location Will contact him with results  Chief Complaint:  Chief Complaint  Patient presents with   ureteral calculus    History of Present Illness:  Jeffery Mckee is a 46 y.o. year old male who is seen for further evaluation of a left ureteral calculus.  He presented to the emergency room on 12/21/2021 with abdominal pain and associated nausea and vomiting.  CT imaging demonstrated bilateral renal cysts, 3 x 3 x 4 mm left distal ureteral stone with associated obstruction on the left.  WBC 16.2.  Creatinine 1.22.  Urinalysis showed pH 6.0, 0-5 RBCs, 0-5 WBCs, rare bacteria, nitrite positive.  No urine culture obtained. He was treated with pain medication and Flomax. At his initial visit on 12/29/21, he reported his pain had been fairly well controlled.  He had taken pain medication on 3-4 occasions in the prior week.  No further nausea or vomiting.  No fevers or chills.  He completed cephalexin prescribed by the emergency room.  No dysuria or gross hematuria. No prior stone episodes.  At his visit on 01/19/2022, he was not aware of passing a stone.  He had not required any pain medication for the prior week.  He reported increased frequency, urgency, and some dysuria.  No gross hematuria.  No fevers, chills, nausea, or vomiting.   KUB did not reveal an obvious stone in the area of the left distal ureter.  He returns today for follow-up.  He has had some intermittent left-sided flank pain since his last visit.  He has not had any  significant pain in the past several days.  He is no longer having frequency, urgency, or dysuria.  No gross hematuria.  No fevers, chills, nausea, or vomiting.  He continues on tamsulosin and is straining his urine.  He is not aware of passing a stone.  Portions of the above documentation were copied from a prior visit for review purposes only.   Past Medical History:  Past Medical History:  Diagnosis Date   Arthritis    GERD (gastroesophageal reflux disease)    Nephrolithiasis     Past Surgical History:  Past Surgical History:  Procedure Laterality Date   CHOLECYSTECTOMY     HERNIA REPAIR      Allergies:  Allergies  Allergen Reactions   Penicillins Nausea Only   Morphine And Related Nausea Only    Family History:  Family History  Problem Relation Age of Onset   Hypertension Mother    Osteoarthritis Mother    Kidney Stones Mother    Diabetes Mother    Cancer Father     Social History:  Social History   Tobacco Use   Smoking status: Every Day    Packs/day: 1.50    Types: Cigarettes   Smokeless tobacco: Never  Substance Use Topics   Alcohol use: No   Drug use: No    ROS: Constitutional:  Negative for fever, chills, weight loss CV: Negative for chest pain, previous MI, hypertension Respiratory:  Negative for shortness of breath, wheezing, sleep apnea, frequent cough  GI:  Negative for nausea, vomiting, bloody stool, GERD  Physical exam: BP 138/90   Pulse 70   Ht '5\' 6"'$  (1.676 m)   Wt 251 lb (113.9 kg)   BMI 40.51 kg/m  GENERAL APPEARANCE:  Well appearing, well developed, well nourished, NAD HEENT:  Atraumatic, normocephalic, oropharynx clear NECK:  Supple without lymphadenopathy or thyromegaly ABDOMEN:  Soft, non-tender, no masses EXTREMITIES:  Moves all extremities well, without clubbing, cyanosis, or edema NEUROLOGIC:  Alert and oriented x 3, normal gait, CN II-XII grossly intact MENTAL STATUS:  appropriate BACK:  Non-tender to palpation, No  CVAT SKIN:  Warm, dry, and intact  Results: U/A: dipstick negative

## 2022-02-12 ENCOUNTER — Ambulatory Visit
Admission: RE | Admit: 2022-02-12 | Discharge: 2022-02-12 | Disposition: A | Discharge status: Home / Self Care | Payer: 59 | Source: Ambulatory Visit | Attending: Urology | Admitting: Urology

## 2022-02-12 DIAGNOSIS — K429 Umbilical hernia without obstruction or gangrene: Secondary | ICD-10-CM | POA: Diagnosis not present

## 2022-02-12 DIAGNOSIS — N201 Calculus of ureter: Secondary | ICD-10-CM

## 2022-02-12 DIAGNOSIS — N132 Hydronephrosis with renal and ureteral calculous obstruction: Secondary | ICD-10-CM | POA: Diagnosis not present

## 2022-02-12 DIAGNOSIS — I700 Atherosclerosis of aorta: Secondary | ICD-10-CM | POA: Diagnosis not present

## 2022-02-12 DIAGNOSIS — N281 Cyst of kidney, acquired: Secondary | ICD-10-CM | POA: Diagnosis not present

## 2022-02-13 NOTE — Progress Notes (Signed)
Letter sent informing patient. 

## 2022-03-26 DIAGNOSIS — K219 Gastro-esophageal reflux disease without esophagitis: Secondary | ICD-10-CM | POA: Diagnosis not present

## 2022-03-26 DIAGNOSIS — Z6838 Body mass index (BMI) 38.0-38.9, adult: Secondary | ICD-10-CM | POA: Diagnosis not present

## 2022-03-26 DIAGNOSIS — Z87442 Personal history of urinary calculi: Secondary | ICD-10-CM | POA: Diagnosis not present

## 2022-03-26 DIAGNOSIS — M545 Low back pain, unspecified: Secondary | ICD-10-CM | POA: Diagnosis not present

## 2022-03-26 DIAGNOSIS — R030 Elevated blood-pressure reading, without diagnosis of hypertension: Secondary | ICD-10-CM | POA: Diagnosis not present

## 2022-03-26 DIAGNOSIS — M25562 Pain in left knee: Secondary | ICD-10-CM | POA: Diagnosis not present

## 2022-03-26 DIAGNOSIS — Z7689 Persons encountering health services in other specified circumstances: Secondary | ICD-10-CM | POA: Diagnosis not present

## 2022-05-18 ENCOUNTER — Other Ambulatory Visit: Payer: Self-pay | Admitting: *Deleted

## 2022-05-18 DIAGNOSIS — Z1211 Encounter for screening for malignant neoplasm of colon: Secondary | ICD-10-CM

## 2022-05-21 ENCOUNTER — Encounter: Payer: Self-pay | Admitting: *Deleted

## 2022-05-21 ENCOUNTER — Ambulatory Visit
Admission: EM | Admit: 2022-05-21 | Discharge: 2022-05-21 | Disposition: A | Discharge status: Home / Self Care | Payer: 59 | Attending: Nurse Practitioner | Admitting: Nurse Practitioner

## 2022-05-21 DIAGNOSIS — Z1152 Encounter for screening for COVID-19: Secondary | ICD-10-CM | POA: Diagnosis not present

## 2022-05-21 DIAGNOSIS — J069 Acute upper respiratory infection, unspecified: Secondary | ICD-10-CM | POA: Diagnosis not present

## 2022-05-21 MED ORDER — PROMETHAZINE-DM 6.25-15 MG/5ML PO SYRP: 5.0000 mL | ORAL_SOLUTION | Freq: Every evening | ORAL | 0 refills | Status: DC | PRN

## 2022-05-21 NOTE — Discharge Instructions (Addendum)
You have a viral upper respiratory infection.  We have tested you for COVID-19.  We will call you tomorrow if this is positive.  Please stay home and isolate until you are aware of the results.    Some things that can make you feel better are: - Increased rest - Increasing fluid with water/sugar free electrolytes - Acetaminophen and ibuprofen as needed for fever/pain.  - Salt water gargling, chloraseptic spray and throat lozenges. - OTC guaifenesin (Mucinex) 600 mg twice daily.  - Saline sinus flushes or a neti pot.  - Humidifying the air. - Cough syrup at night time as needed for dry cough.

## 2022-05-21 NOTE — ED Triage Notes (Signed)
Pt states that Friday morning started with sore throat and rest of sx started Saturday, cough, congestion and sinus pressure. He is taking tylenol cold and flu.

## 2022-05-21 NOTE — ED Provider Notes (Signed)
[facility]    CSN: [id number] Arrival date & time: 05/21/22  1612      History   Chief Complaint Chief Complaint  Patient presents with   Cough   Nasal Congestion   Sore Throat   Facial Pain    HPI Jeffery Mckee is a 46 y.o. male.   Patient presents with onset of symptoms Friday morning.  He endorses body aches, chills, congested cough, nasal congestion, runny nose, sneezing, sinus pressure/headache, decreased appetite, and fatigue.  He denies chest pain, shortness of breath, chest tightness or congestion in his chest, postnasal drainage, sore throat, tooth or ear pain, abdominal pain, nausea/vomiting, diarrhea, new rash.  Reports his family is currently sick.  Has taken Tylenol Cold and flu as well as tried hot showers with minimal relief.     Past Medical History:  Diagnosis Date   Arthritis    GERD (gastroesophageal reflux disease)    Nephrolithiasis     Patient Active Problem List   Diagnosis Date Noted   Ureteral calculus 12/29/2021   Bilateral renal cysts 12/29/2021    Past Surgical History:  Procedure Laterality Date   CHOLECYSTECTOMY     HERNIA REPAIR         Home Medications    Prior to Admission medications   Medication Sig Start Date End Date Taking? Authorizing Provider  esomeprazole (NEXIUM) 20 MG capsule Take 20 mg by mouth daily at 12 noon.   Yes [provider]  omeprazole (PRILOSEC) 40 MG capsule Take 40 mg by mouth daily. 04/11/22  Yes [provider]  promethazine-dextromethorphan (PROMETHAZINE-DM) 6.25-15 MG/5ML syrup Take 5 mLs by mouth at bedtime as needed for cough. Do not take with alcohol or while driving or operating heavy machinery 05/21/22  Yes Eulogio Bear, NP  oxyCODONE-acetaminophen (PERCOCET/ROXICET) 5-325 MG tablet Take 1 tablet by mouth every 6 (six) hours as needed for severe pain. 02/02/22   Stoneking, Reece Leader., MD  tamsulosin (FLOMAX) 0.4 MG CAPS capsule Take 1 capsule (0.4 mg total) by  mouth daily. 02/02/22   Stoneking, Reece Leader., MD    Family History Family History  Problem Relation Age of Onset   Hypertension Mother    Osteoarthritis Mother    Kidney Stones Mother    Diabetes Mother    Cancer Father     Social History Social History   Tobacco Use   Smoking status: Every Day    Packs/day: 1.50    Types: Cigarettes   Smokeless tobacco: Never  Vaping Use   Vaping Use: Never used  Substance Use Topics   Alcohol use: No   Drug use: No     Allergies   Penicillins and Morphine and related   Review of Systems Review of Systems Per HPI  Physical Exam Triage Vital Signs ED Triage Vitals [05/21/22 1632]  Enc Vitals Group     BP 124/86     Pulse Rate 78     Resp 18     Temp 97.9 F (36.6 C)     Temp Source Oral     SpO2 95 %     Weight      Height      Head Circumference      Peak Flow      Pain Score      Pain Loc      Pain Edu?      Excl. in [place]?    No data found.  Updated Vital Signs BP 124/86 (BP  Location: Right Arm)   Pulse 78   Temp 97.9 F (36.6 C) (Oral)   Resp 18   SpO2 95%   Visual Acuity Right Eye Distance:   Left Eye Distance:   Bilateral Distance:    Right Eye Near:   Left Eye Near:    Bilateral Near:     Physical Exam Vitals and nursing note reviewed.  Constitutional:      General: He is not in acute distress.    Appearance: Normal appearance. He is not ill-appearing or toxic-appearing.  HENT:     Head: Normocephalic and atraumatic.     Right Ear: Tympanic membrane, ear canal and external ear normal.     Left Ear: Tympanic membrane, ear canal and external ear normal.     Nose: Congestion and rhinorrhea present.     Mouth/Throat:     Mouth: Mucous membranes are moist.     Pharynx: Oropharynx is clear. Posterior oropharyngeal erythema present. No oropharyngeal exudate.  Eyes:     General: No scleral icterus.    Extraocular Movements: Extraocular movements intact.  Cardiovascular:     Rate and Rhythm:  Normal rate and regular rhythm.  Pulmonary:     Effort: Pulmonary effort is normal. No respiratory distress.     Breath sounds: Normal breath sounds. No wheezing, rhonchi or rales.  Abdominal:     General: Abdomen is flat. Bowel sounds are normal. There is no distension.     Palpations: Abdomen is soft.  Musculoskeletal:     Cervical back: Normal range of motion and neck supple.  Lymphadenopathy:     Cervical: No cervical adenopathy.  Skin:    General: Skin is warm and dry.     Coloration: Skin is not jaundiced or pale.     Findings: No erythema or rash.  Neurological:     Mental Status: He is alert and oriented to person, place, and time.  Psychiatric:        Behavior: Behavior is cooperative.      UC Treatments / Results  Labs (all labs ordered are listed, but only abnormal results are displayed) Labs Reviewed  SARS CORONAVIRUS 2 (TAT 6-24 HRS)    EKG   Radiology No results found.  Procedures Procedures (including critical care time)  Medications Ordered in UC Medications - No data to display  Initial Impression / Assessment and Plan / UC Course  I have reviewed the triage vital signs and the nursing notes.  Pertinent labs & imaging results that were available during my care of the patient were reviewed by me and considered in my medical decision making (see chart for details).    Patient is well-appearing, normotensive, afebrile, not tachycardic, not tachypneic, oxygenating well on room air.  Discussed that symptoms are likely viral in etiology.  COVID-19 testing obtained.  He is not a candidate for antiviral therapy if he test positive.  Supportive care discussed.  Start cough syrup as needed to help suppress cough.  ER cautions and return precautions discussed.  Note given for work.  The patient was given the opportunity to ask questions.  All questions answered to their satisfaction.  The patient is in agreement to this plan.   Final Clinical Impressions(s) / UC  Diagnoses   Final diagnoses:  Encounter for screening for COVID-19  Viral URI with cough     Discharge Instructions      You have a viral upper respiratory infection.  We have tested you for COVID-19.  We will  call you tomorrow if this is positive.  Please stay home and isolate until you are aware of the results.    Some things that can make you feel better are: - Increased rest - Increasing fluid with water/sugar free electrolytes - Acetaminophen and ibuprofen as needed for fever/pain.  - Salt water gargling, chloraseptic spray and throat lozenges. - OTC guaifenesin (Mucinex) 600 mg twice daily.  - Saline sinus flushes or a neti pot.  - Humidifying the air. - Cough syrup at night time as needed for dry cough.      ED Prescriptions     Medication Sig Dispense Auth. Provider   promethazine-dextromethorphan (PROMETHAZINE-DM) 6.25-15 MG/5ML syrup Take 5 mLs by mouth at bedtime as needed for cough. Do not take with alcohol or while driving or operating heavy machinery 118 mL Eulogio Bear, NP      PDMP not reviewed this encounter.   Eulogio Bear, NP 05/21/22 1728

## 2022-05-22 ENCOUNTER — Ambulatory Visit: Payer: 59 | Admitting: Surgery

## 2022-05-22 LAB — SARS CORONAVIRUS 2 (TAT 6-24 HRS): SARS Coronavirus 2: NEGATIVE

## 2022-06-19 ENCOUNTER — Ambulatory Visit: Payer: 59 | Admitting: Surgery

## 2022-06-19 ENCOUNTER — Encounter: Payer: Self-pay | Admitting: Surgery

## 2022-06-19 VITALS — BP 131/75 | HR 73 | Temp 97.9°F | Resp 16 | Ht 66.0 in | Wt 245.0 lb

## 2022-06-19 DIAGNOSIS — Z1211 Encounter for screening for malignant neoplasm of colon: Secondary | ICD-10-CM

## 2022-06-19 MED ORDER — SUTAB 1479-225-188 MG PO TABS: ORAL_TABLET | ORAL | 0 refills | Status: DC

## 2022-06-21 NOTE — Progress Notes (Signed)
[facility] History and Physical  Reason for Referral: Launa Grill, NP Referring Physician: Screening colonoscopy   Chief Complaint   New Patient (Initial Visit)     Jeffery Mckee is a 46 y.o. male.  HPI: Patient presents to discuss screening colonoscopy.  He has never had a colonoscopy previously.  He has some abdominal pain associated with reflux in his hiatal hernia, but states he denies any issues with bowel movements.  He denies any hematochezia or melena.  He will occasionally note small drops of blood on toilet paper after wiping when he had to strain heavily to have a bowel movement.  He denies any nausea or vomiting with eating.  He has a family history of colorectal cancer in his father, who was diagnosed at the age of 4.  He confirms knowing he has an umbilical hernia, but it never causes him pain, so he has never had it evaluated.  His surgical history is significant for cholecystectomy and inguinal hernia, though he is unsure of which side the hernia was repaired on.  He smokes 2 packs of cigarettes per day.  He denies use of alcohol and illicit drugs.  He denies use of blood thinning medications.  His surgical history significant for reflux and kidney stones.  Past Medical History:  Diagnosis Date   Arthritis    GERD (gastroesophageal reflux disease)    Nephrolithiasis     Past Surgical History:  Procedure Laterality Date   CHOLECYSTECTOMY     HERNIA REPAIR      Family History  Problem Relation Age of Onset   Hypertension Mother    Osteoarthritis Mother    Kidney Stones Mother    Diabetes Mother    Cancer Father     Social History   Tobacco Use   Smoking status: Every Day    Packs/day: 1.50    Types: Cigarettes   Smokeless tobacco: Never  Vaping Use   Vaping Use: Never used  Substance Use Topics   Alcohol use: No   Drug use: No    Medications: I have reviewed the patient's current medications. Allergies as of 06/19/2022        Reactions   Penicillins Nausea Only   Morphine And Related Nausea Only        Medication List        Accurate as of June 19, 2022 11:59 PM. If you have any questions, ask your nurse or doctor.          STOP taking these medications    esomeprazole 20 MG capsule Commonly known as: NEXIUM Stopped by: Esparanza Krider A Mannie Wineland, DO   oxyCODONE-acetaminophen 5-325 MG tablet Commonly known as: PERCOCET/ROXICET Stopped by: Rochelle Larue A Hitesh Fouche, DO   promethazine-dextromethorphan 6.25-15 MG/5ML syrup Commonly known as: PROMETHAZINE-DM Stopped by: Ronn Smolinsky A Shail Urbas, DO   tamsulosin 0.4 MG Caps capsule Commonly known as: FLOMAX Stopped by: Hollan Philipp A Florine Sprenkle, DO       TAKE these medications    omeprazole 40 MG capsule Commonly known as: PRILOSEC Take 40 mg by mouth daily.   Sutab [phone] MG Tabs Generic drug: Sodium Sulfate-Mag Sulfate-KCl Use as directed Started by: Trysta Showman A Johnrobert Foti, DO         ROS:  Constitutional: negative for chills, fatigue, and fevers Eyes: negative for visual disturbance and pain Ears, nose, mouth, throat, and face: positive for sinus problems, negative for ear drainage and sore throat Respiratory: negative for cough, wheezing, and shortness of breath Cardiovascular: negative for chest  pain and palpitations Gastrointestinal: positive for reflux symptoms, negative for abdominal pain, nausea, and vomiting Genitourinary:negative for dysuria, frequency, and urinary retention Integument/breast: negative for dryness and rash Hematologic/lymphatic: negative for bleeding and lymphadenopathy Musculoskeletal:positive for back pain and joint pain Neurological: negative for dizziness and tremors Endocrine: negative for temperature intolerance  Blood pressure 131/75, pulse 73, temperature 97.9 F (36.6 C), temperature source Oral, resp. rate 16, height '5\' 6"'$  (1.676 m), weight 245 lb (111.1 kg), SpO2 94 %. Physical  Exam Vitals reviewed.  Constitutional:      Appearance: Normal appearance.  HENT:     Head: Normocephalic and atraumatic.  Eyes:     Extraocular Movements: Extraocular movements intact.     Pupils: Pupils are equal, round, and reactive to light.  Cardiovascular:     Rate and Rhythm: Normal rate and regular rhythm.  Pulmonary:     Effort: Pulmonary effort is normal.     Breath sounds: Normal breath sounds.  Abdominal:     Comments: Abdomen soft, nondistended, no percussion tenderness, nontender to palpation; no rigidity, guarding, rebound tenderness: Soft and reducible umbilical hernia, nontender  Musculoskeletal:        General: Normal range of motion.     Cervical back: Normal range of motion.  Skin:    General: Skin is warm and dry.  Neurological:     General: No focal deficit present.     Mental Status: He is alert and oriented to person, place, and time.  Psychiatric:        Mood and Affect: Mood normal.        Behavior: Behavior normal.     Results: No results found for this or any previous visit (from the past 48 hour(s)).  No results found.   Assessment & Plan:  Jeffery Mckee is a 46 y.o. male who presents to discuss screening colonoscopy.  -I discussed the importance of screening colonoscopies to evaluate for colorectal cancer.  I also explained its importance given his family history of colorectal cancer. -The risk and benefits of colonoscopy were discussed including but not limited to bleeding, infection, missed lesion, and perforation requiring surgical intervention.  After careful consideration, Jeffery Mckee has decided to proceed with colonoscopy -I discussed the Sutab prep with the patient the requirements the day prior to surgery. -I advised him that he should likely take off the afternoon the day before the procedure and he will need to take off the day of the procedure, as he cannot operate any heavy machinery or vehicles after undergoing  anesthesia -Prescription provided for Sutab prep in addition to a coupon.  Advised the patient to let us know if he is unable to afford this, as we will switch him to a different prep -Information provided regarding colonoscopies -We will call the patient to schedule colonoscopy  All questions were answered to the satisfaction of the patient.  Graciella Freer, DO [facility] [address] [place], [address] [address] [phone] (office)

## 2022-07-03 NOTE — H&P (Signed)
[facility] History and Physical   Reason for Referral: Launa Grill, NP Referring Physician: Screening colonoscopy    Chief Complaint   New Patient (Initial Visit)        Jeffery Mckee is a 46 y.o. male.  HPI: Patient presents to discuss screening colonoscopy.  He has never had a colonoscopy previously.  He has some abdominal pain associated with reflux in his hiatal hernia, but states he denies any issues with bowel movements.  He denies any hematochezia or melena.  He will occasionally note small drops of blood on toilet paper after wiping when he had to strain heavily to have a bowel movement.  He denies any nausea or vomiting with eating.  He has a family history of colorectal cancer in his father, who was diagnosed at the age of 15.  He confirms knowing he has an umbilical hernia, but it never causes him pain, so he has never had it evaluated.  His surgical history is significant for cholecystectomy and inguinal hernia, though he is unsure of which side the hernia was repaired on.  He smokes 2 packs of cigarettes per day.  He denies use of alcohol and illicit drugs.  He denies use of blood thinning medications.  His surgical history significant for reflux and kidney stones.       Past Medical History:  Diagnosis Date   Arthritis     GERD (gastroesophageal reflux disease)     Nephrolithiasis             Past Surgical History:  Procedure Laterality Date   CHOLECYSTECTOMY       HERNIA REPAIR               Family History  Problem Relation Age of Onset   Hypertension Mother     Osteoarthritis Mother     Kidney Stones Mother     Diabetes Mother     Cancer Father        Social History         Tobacco Use   Smoking status: Every Day      Packs/day: 1.50      Types: Cigarettes   Smokeless tobacco: Never  Vaping Use   Vaping Use: Never used  Substance Use Topics   Alcohol use: No   Drug use: No      Medications: I have reviewed the patient's  current medications. Allergies as of 06/19/2022         Reactions    Penicillins Nausea Only    Morphine And Related Nausea Only            Medication List           Accurate as of June 19, 2022 11:59 PM. If you have any questions, ask your nurse or doctor.              STOP taking these medications     esomeprazole 20 MG capsule Commonly known as: NEXIUM Stopped by: Ayerim Berquist A Hilary Pundt, DO    oxyCODONE-acetaminophen 5-325 MG tablet Commonly known as: PERCOCET/ROXICET Stopped by: Alizzon Dioguardi A Bless Lisenby, DO    promethazine-dextromethorphan 6.25-15 MG/5ML syrup Commonly known as: PROMETHAZINE-DM Stopped by: Dezaria Methot A Chaunice Obie, DO    tamsulosin 0.4 MG Caps capsule Commonly known as: FLOMAX Stopped by: Jayelyn Barno A Amylia Collazos, DO           TAKE these medications     omeprazole 40 MG capsule Commonly known as: PRILOSEC Take 40 mg by mouth daily.  Sutab [phone] MG Tabs Generic drug: Sodium Sulfate-Mag Sulfate-KCl Use as directed Started by: Marlos Carmen A Dow Blahnik, DO               ROS:  Constitutional: negative for chills, fatigue, and fevers Eyes: negative for visual disturbance and pain Ears, nose, mouth, throat, and face: positive for sinus problems, negative for ear drainage and sore throat Respiratory: negative for cough, wheezing, and shortness of breath Cardiovascular: negative for chest pain and palpitations Gastrointestinal: positive for reflux symptoms, negative for abdominal pain, nausea, and vomiting Genitourinary:negative for dysuria, frequency, and urinary retention Integument/breast: negative for dryness and rash Hematologic/lymphatic: negative for bleeding and lymphadenopathy Musculoskeletal:positive for back pain and joint pain Neurological: negative for dizziness and tremors Endocrine: negative for temperature intolerance   Blood pressure 131/75, pulse 73, temperature 97.9 F (36.6 C), temperature source Oral, resp.  rate 16, height '5\' 6"'$  (1.676 m), weight 245 lb (111.1 kg), SpO2 94 %. Physical Exam Vitals reviewed.  Constitutional:      Appearance: Normal appearance.  HENT:     Head: Normocephalic and atraumatic.  Eyes:     Extraocular Movements: Extraocular movements intact.     Pupils: Pupils are equal, round, and reactive to light.  Cardiovascular:     Rate and Rhythm: Normal rate and regular rhythm.  Pulmonary:     Effort: Pulmonary effort is normal.     Breath sounds: Normal breath sounds.  Abdominal:     Comments: Abdomen soft, nondistended, no percussion tenderness, nontender to palpation; no rigidity, guarding, rebound tenderness: Soft and reducible umbilical hernia, nontender  Musculoskeletal:        General: Normal range of motion.     Cervical back: Normal range of motion.  Skin:    General: Skin is warm and dry.  Neurological:     General: No focal deficit present.     Mental Status: He is alert and oriented to person, place, and time.  Psychiatric:        Mood and Affect: Mood normal.        Behavior: Behavior normal.        Results: Lab Results Last 48 Hours  No results found for this or any previous visit (from the past 48 hour(s)).     Imaging Results (Last 48 hours)  No results found.       Assessment & Plan:  Jeffery Mckee is a 46 y.o. male who presents to discuss screening colonoscopy.   -I discussed the importance of screening colonoscopies to evaluate for colorectal cancer.  I also explained its importance given his family history of colorectal cancer. -The risk and benefits of colonoscopy were discussed including but not limited to bleeding, infection, missed lesion, and perforation requiring surgical intervention.  After careful consideration, Jeffery Mckee has decided to proceed with colonoscopy -I discussed the Sutab prep with the patient the requirements the day prior to surgery. -I advised him that he should likely take off the afternoon the day  before the procedure and he will need to take off the day of the procedure, as he cannot operate any heavy machinery or vehicles after undergoing anesthesia -Prescription provided for Sutab prep in addition to a coupon.  Advised the patient to let us know if he is unable to afford this, as we will switch him to a different prep -Information provided regarding colonoscopies -We will call the patient to schedule colonoscopy   All questions were answered to the satisfaction of the patient.  Graciella Freer, DO [facility] [address] [place], [place] [address] [phone] (office)

## 2022-07-17 ENCOUNTER — Encounter (HOSPITAL_COMMUNITY)
Admission: RE | Disposition: A | Discharge status: Home / Self Care | Payer: Self-pay | Source: Home / Self Care | Attending: Surgery

## 2022-07-17 ENCOUNTER — Ambulatory Visit (HOSPITAL_COMMUNITY): Payer: 59 | Admitting: Anesthesiology

## 2022-07-17 ENCOUNTER — Ambulatory Visit (HOSPITAL_BASED_OUTPATIENT_CLINIC_OR_DEPARTMENT_OTHER): Payer: 59 | Admitting: Anesthesiology

## 2022-07-17 ENCOUNTER — Encounter (HOSPITAL_COMMUNITY): Payer: Self-pay | Admitting: Surgery

## 2022-07-17 ENCOUNTER — Other Ambulatory Visit: Payer: Self-pay

## 2022-07-17 ENCOUNTER — Ambulatory Visit (HOSPITAL_COMMUNITY)
Admission: RE | Admit: 2022-07-17 | Discharge: 2022-07-17 | Disposition: A | Discharge status: Home / Self Care | Payer: 59 | Attending: Surgery | Admitting: Surgery

## 2022-07-17 DIAGNOSIS — K219 Gastro-esophageal reflux disease without esophagitis: Secondary | ICD-10-CM | POA: Diagnosis not present

## 2022-07-17 DIAGNOSIS — Z6841 Body Mass Index (BMI) 40.0 and over, adult: Secondary | ICD-10-CM | POA: Insufficient documentation

## 2022-07-17 DIAGNOSIS — D128 Benign neoplasm of rectum: Secondary | ICD-10-CM | POA: Diagnosis not present

## 2022-07-17 DIAGNOSIS — D129 Benign neoplasm of anus and anal canal: Secondary | ICD-10-CM

## 2022-07-17 DIAGNOSIS — K429 Umbilical hernia without obstruction or gangrene: Secondary | ICD-10-CM | POA: Diagnosis not present

## 2022-07-17 DIAGNOSIS — Z1211 Encounter for screening for malignant neoplasm of colon: Secondary | ICD-10-CM | POA: Diagnosis not present

## 2022-07-17 DIAGNOSIS — Z9049 Acquired absence of other specified parts of digestive tract: Secondary | ICD-10-CM | POA: Insufficient documentation

## 2022-07-17 DIAGNOSIS — D127 Benign neoplasm of rectosigmoid junction: Secondary | ICD-10-CM

## 2022-07-17 DIAGNOSIS — K649 Unspecified hemorrhoids: Secondary | ICD-10-CM

## 2022-07-17 DIAGNOSIS — K621 Rectal polyp: Secondary | ICD-10-CM | POA: Insufficient documentation

## 2022-07-17 DIAGNOSIS — K449 Diaphragmatic hernia without obstruction or gangrene: Secondary | ICD-10-CM | POA: Insufficient documentation

## 2022-07-17 DIAGNOSIS — F17210 Nicotine dependence, cigarettes, uncomplicated: Secondary | ICD-10-CM | POA: Insufficient documentation

## 2022-07-17 DIAGNOSIS — Z800 Family history of malignant neoplasm of digestive organs: Secondary | ICD-10-CM | POA: Diagnosis not present

## 2022-07-17 DIAGNOSIS — R69 Illness, unspecified: Secondary | ICD-10-CM | POA: Diagnosis not present

## 2022-07-17 DIAGNOSIS — K635 Polyp of colon: Secondary | ICD-10-CM | POA: Diagnosis not present

## 2022-07-17 HISTORY — PX: POLYPECTOMY: SHX149

## 2022-07-17 HISTORY — PX: COLONOSCOPY WITH PROPOFOL: SHX5780

## 2022-07-17 SURGERY — COLONOSCOPY WITH PROPOFOL
Anesthesia: General

## 2022-07-17 MED ORDER — PROPOFOL 10 MG/ML IV BOLUS
INTRAVENOUS | Status: DC | PRN
  Administered 2022-07-17: 30 mg via INTRAVENOUS

## 2022-07-17 MED ORDER — LIDOCAINE HCL (CARDIAC) PF 100 MG/5ML IV SOSY
PREFILLED_SYRINGE | INTRAVENOUS | Status: DC | PRN
  Administered 2022-07-17: 50 mg via INTRAVENOUS

## 2022-07-17 MED ORDER — LACTATED RINGERS IV SOLN: INTRAVENOUS | Status: DC

## 2022-07-17 MED ORDER — PROPOFOL 1000 MG/100ML IV EMUL
INTRAVENOUS | Status: AC
  Filled 2022-07-17: qty 400

## 2022-07-17 MED ORDER — PROPOFOL 500 MG/50ML IV EMUL
INTRAVENOUS | Status: DC | PRN
  Administered 2022-07-17: 150 ug/kg/min via INTRAVENOUS

## 2022-07-17 NOTE — Interval H&P Note (Signed)
History and Physical Interval Note:  07/17/2022 7:01 AM  Jeffery Mckee  has presented today for surgery, with the diagnosis of SCREENING FOR COLON CANCER.  The various methods of treatment have been discussed with the patient and family. After consideration of risks, benefits and other options for treatment, the patient has consented to  Procedure(s): COLONOSCOPY WITH PROPOFOL (N/A) as a surgical intervention.  The patient's history has been reviewed, patient examined, no change in status, stable for surgery.  I have reviewed the patient's chart and labs.  Questions were answered to the patient's satisfaction.     [place]

## 2022-07-17 NOTE — Anesthesia Postprocedure Evaluation (Signed)
Anesthesia Post Note  Patient: Jeffery Mckee  Procedure(s) Performed: COLONOSCOPY WITH PROPOFOL POLYPECTOMY INTESTINAL  Patient location during evaluation: Phase II Anesthesia Type: General Level of consciousness: awake and alert Pain management: pain level controlled Vital Signs Assessment: post-procedure vital signs reviewed and stable Respiratory status: spontaneous breathing, nonlabored ventilation, respiratory function stable and patient connected to nasal cannula oxygen Cardiovascular status: blood pressure returned to baseline and stable Postop Assessment: no apparent nausea or vomiting Anesthetic complications: no   No notable events documented.   Last Vitals:  Vitals:   07/17/22 0652 07/17/22 0803  BP: (!) 116/91 (!) 97/58  Pulse: 77 62  Resp: 17 (!) 21  Temp: 36.7 C 36.4 C  SpO2: 96% 94%    Last Pain:  Vitals:   07/17/22 0803  TempSrc: Oral  PainSc: 0-No pain                 Darling Cieslewicz Clyde Canterbury

## 2022-07-17 NOTE — Op Note (Signed)
[facility] Patient Name: Jeffery Mckee Procedure Date: 07/17/2022 7:09 AM MRN: [id number] Date of Birth: 11/29/1975 Attending MD: Flint Melter Millissa Deese DO, ,  CSN: [id number] Age: 46 Admit Type: Outpatient Procedure:                Colonoscopy Indications:              Screening for colorectal malignant neoplasm,                            Screening for colon cancer: Family history of                            colorectal cancer in distant relative(s) 34 or older Providers:                Flint Melter. Toria Monte DO, Crystal Page, Everardo Pacific Referring MD:              Medicines:                Monitored Anesthesia Care Complications:            No immediate complications. Estimated Blood Loss:     Estimated blood loss: none. Procedure:                Pre-Anesthesia Assessment:                           - Monitored anesthesia care under the supervision                            of a CRNA was determined to be medically necessary                            for this procedure based on review of the patient's                            medical history, medications, and prior anesthesia                            history.                           After obtaining informed consent, the colonoscope                            was passed under direct vision. Throughout the                            procedure, the patient's blood pressure, pulse, and                            oxygen saturations were monitored continuously. The                            [id number]-HQ190L ([id number]) scope was introduced through  the anus and advanced to the the cecum, identified                            by the ileocecal valve. The colonoscopy was                            performed without difficulty. The patient tolerated                            the procedure well. The quality of the bowel                            preparation was evaluated  using the BBPS [facility]                            Bowel Preparation Scale) with scores of: Right                            Colon = 2 (minor amount of residual staining, small                            fragments of stool and/or opaque liquid, but mucosa                            seen well), Transverse Colon = 3 (entire mucosa                            seen well with no residual staining, small                            fragments of stool or opaque liquid) and Left Colon                            = 3 (entire mucosa seen well with no residual                            staining, small fragments of stool or opaque                            liquid). The total BBPS score equals 8. The entire                            colon was examined. Scope insertion time was 8                            minutes. Scope withdrawal time was 11 minutes. The                            total duration of the procedure was 19 minutes. Scope In: 7:35:46 AM Scope Out: 7:59:55 AM Scope Withdrawal Time: 0 hours 16 minutes 43 seconds  Total Procedure Duration: 0 hours 24 minutes 9 seconds  Findings:  The perianal and digital rectal examinations were normal. Pertinent       negatives include normal sphincter tone.      A 10 mm polyp was found in the recto-sigmoid colon. The polyp was       pedunculated. The polyp was removed with a hot snare. Resection and       retrieval were complete. The pathology specimen was placed into Bottle       Number 1.      A 3 mm polyp was found in the rectum. The polyp was sessile. The polyp       was removed with a hot snare. Resection and retrieval were complete.      The exam was otherwise without abnormality on direct and retroflexion       views. Impression:               - One 10 mm polyp at the recto-sigmoid colon,                            removed with a hot snare. Resected and retrieved.                           - One 3 mm polyp in the rectum, removed with a hot                             snare. Resected and retrieved.                           - The examination was otherwise normal on direct                            and retroflexion views.                           - Hemorrhoids. Moderate Sedation:      An independent trained observer was present and continuously monitored       the patient. Recommendation:           - Discharge patient to home.                           - Resume regular diet.                           - Continue present medications.                           - Await pathology results.                           - Repeat colonoscopy date to be determined after                            pending pathology results are reviewed for                            surveillance. Procedure Code(s):        --- Professional ---  45385, Colonoscopy, flexible; with removal of                            tumor(s), polyp(s), or other lesion(s) by snare                            technique Diagnosis Code(s):        --- Professional ---                           Z12.11, Encounter for screening for malignant                            neoplasm of colon                           Z80.0, Family history of malignant neoplasm of                            digestive organs                           D12.7, Benign neoplasm of rectosigmoid junction                           D12.8, Benign neoplasm of rectum                           K64.9, Unspecified hemorrhoids CPT copyright 2022 American Medical Association. All rights reserved. The codes documented in this report are preliminary and upon coder review may  be revised to meet current compliance requirements. Barnetta Chapel A Christianjames Soule DO,  07/17/2022 8:13:43 AM Number of Addenda: 0

## 2022-07-17 NOTE — Anesthesia Preprocedure Evaluation (Signed)
Anesthesia Evaluation  Patient identified by MRN, date of birth, ID band Patient awake    Reviewed: Allergy & Precautions, H&P , NPO status , Patient's Chart, lab work & pertinent test results, reviewed documented beta blocker date and time   Airway Mallampati: III  TM Distance: >3 FB Neck ROM: full    Dental no notable dental hx.    Pulmonary neg pulmonary ROS, Current Smoker and Patient abstained from smoking.   Pulmonary exam normal breath sounds clear to auscultation       Cardiovascular Exercise Tolerance: Good negative cardio ROS  Rhythm:regular Rate:Normal     Neuro/Psych negative neurological ROS  negative psych ROS   GI/Hepatic Neg liver ROS,GERD  ,,  Endo/Other    Morbid obesity  Renal/GU negative Renal ROS  negative genitourinary   Musculoskeletal   Abdominal   Peds  Hematology negative hematology ROS (+)   Anesthesia Other Findings BMI 40.77  Reproductive/Obstetrics negative OB ROS                             Anesthesia Physical Anesthesia Plan  ASA: 3  Anesthesia Plan: General   Post-op Pain Management:    Induction:   PONV Risk Score and Plan: TIVA  Airway Management Planned:   Additional Equipment:   Intra-op Plan:   Post-operative Plan:   Informed Consent: I have reviewed the patients History and Physical, chart, labs and discussed the procedure including the risks, benefits and alternatives for the proposed anesthesia with the patient or authorized representative who has indicated his/her understanding and acceptance.     Dental Advisory Given  Plan Discussed with: CRNA  Anesthesia Plan Comments:        Anesthesia Quick Evaluation

## 2022-07-17 NOTE — Anesthesia Procedure Notes (Signed)
Procedure Name: MAC Date/Time: 07/17/2022 7:39 AM  Performed by: Lieutenant Diego, CRNAPre-anesthesia Checklist: Patient identified, Emergency Drugs available, Suction available, Patient being monitored and Timeout performed Patient Re-evaluated:Patient Re-evaluated prior to induction Oxygen Delivery Method: Nasal cannula Preoxygenation: Pre-oxygenation with 100% oxygen Induction Type: IV induction

## 2022-07-17 NOTE — Transfer of Care (Signed)
Immediate Anesthesia Transfer of Care Note  Patient: [name]  Procedure(s) Performed: COLONOSCOPY WITH PROPOFOL POLYPECTOMY INTESTINAL  Patient Location: PACU  Anesthesia Type:MAC  Level of Consciousness: drowsy  Airway & Oxygen Therapy: Patient Spontanous Breathing and Patient connected to nasal cannula oxygen  Post-op Assessment: Report given to RN and Post -op Vital signs reviewed and stable  Post vital signs: Reviewed and stable  Last Vitals:  Vitals Value Taken Time  BP    Temp    Pulse    Resp    SpO2      Last Pain:  Vitals:   07/17/22 0730  TempSrc:   PainSc: 0-No pain      Patients Stated Pain Goal: 2 (51/83/35 8251)  Complications: No notable events documented.

## 2022-07-18 LAB — SURGICAL PATHOLOGY

## 2022-07-20 ENCOUNTER — Telehealth (INDEPENDENT_AMBULATORY_CARE_PROVIDER_SITE_OTHER): Payer: 59 | Admitting: Surgery

## 2022-07-20 DIAGNOSIS — D125 Benign neoplasm of sigmoid colon: Secondary | ICD-10-CM

## 2022-07-20 NOTE — Telephone Encounter (Signed)
[facility]  Called the patient to update him on the pathology findings of polyps removed at the time of colonoscopy.  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  Given this finding, I recommend that he have a repeat colonoscopy in 3 years.  He has otherwise been doing well since the procedure.  He denies any abdominal pain, issues with eating, or bloody bowel movements.  All questions were answered to his expressed satisfaction.  Pathology: A. COLON, RECTOSIGMOID, POLYPECTOMY:  - Traditional serrated adenoma   B. COLON, RECTOSIGMOID, POLYPECTOMY:  - Hyperplastic polyp.  - No dysplasia or malignancy.   Recommendation: Repeat colonoscopy in 3 years.  Graciella Freer, DO [facility] [address] [place], [place] [address] [phone] (office)

## 2022-07-23 ENCOUNTER — Encounter (HOSPITAL_COMMUNITY): Payer: Self-pay | Admitting: Surgery
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
## 2022-11-08 ENCOUNTER — Ambulatory Visit (INDEPENDENT_AMBULATORY_CARE_PROVIDER_SITE_OTHER): Payer: 59

## 2022-11-08 ENCOUNTER — Ambulatory Visit: Payer: 59

## 2022-11-08 ENCOUNTER — Encounter: Payer: Self-pay | Admitting: Rheumatology

## 2022-11-08 ENCOUNTER — Ambulatory Visit: Payer: 59 | Attending: Rheumatology | Admitting: Rheumatology

## 2022-11-08 VITALS — BP 130/85 | HR 65 | Resp 16 | Ht 68.0 in | Wt 242.6 lb

## 2022-11-08 DIAGNOSIS — M79642 Pain in left hand: Secondary | ICD-10-CM

## 2022-11-08 DIAGNOSIS — M79671 Pain in right foot: Secondary | ICD-10-CM

## 2022-11-08 DIAGNOSIS — G8929 Other chronic pain: Secondary | ICD-10-CM

## 2022-11-08 DIAGNOSIS — Z8261 Family history of arthritis: Secondary | ICD-10-CM

## 2022-11-08 DIAGNOSIS — Z800 Family history of malignant neoplasm of digestive organs: Secondary | ICD-10-CM

## 2022-11-08 DIAGNOSIS — B351 Tinea unguium: Secondary | ICD-10-CM | POA: Diagnosis not present

## 2022-11-08 DIAGNOSIS — M79641 Pain in right hand: Secondary | ICD-10-CM

## 2022-11-08 DIAGNOSIS — R200 Anesthesia of skin: Secondary | ICD-10-CM

## 2022-11-08 DIAGNOSIS — M545 Low back pain, unspecified: Secondary | ICD-10-CM

## 2022-11-08 DIAGNOSIS — R7681 Abnormal rheumatoid factor and anti-citrullinated protein antibody without rheumatoid arthritis: Secondary | ICD-10-CM

## 2022-11-08 DIAGNOSIS — R7689 Other specified abnormal immunological findings in serum: Secondary | ICD-10-CM

## 2022-11-08 DIAGNOSIS — M25562 Pain in left knee: Secondary | ICD-10-CM

## 2022-11-08 DIAGNOSIS — R768 Other specified abnormal immunological findings in serum: Secondary | ICD-10-CM | POA: Diagnosis not present

## 2022-11-08 DIAGNOSIS — F17200 Nicotine dependence, unspecified, uncomplicated: Secondary | ICD-10-CM

## 2022-11-08 DIAGNOSIS — N201 Calculus of ureter: Secondary | ICD-10-CM | POA: Diagnosis not present

## 2022-11-08 DIAGNOSIS — K649 Unspecified hemorrhoids: Secondary | ICD-10-CM

## 2022-11-08 DIAGNOSIS — R202 Paresthesia of skin: Secondary | ICD-10-CM

## 2022-11-08 DIAGNOSIS — M79672 Pain in left foot: Secondary | ICD-10-CM

## 2022-11-08 DIAGNOSIS — Z8719 Personal history of other diseases of the digestive system: Secondary | ICD-10-CM | POA: Diagnosis not present

## 2022-11-08 NOTE — Patient Instructions (Signed)

## 2022-11-16 ENCOUNTER — Encounter: Payer: Self-pay | Admitting: Physician Assistant

## 2022-11-29 ENCOUNTER — Ambulatory Visit: Payer: 59 | Admitting: Rheumatology

## 2022-12-01 NOTE — Progress Notes (Unsigned)
Office Visit Note  Patient: Jeffery Mckee             Date of Birth: Jul 11, 1976           MRN: [id number]             PCP: Wanita Chamberlain, PA-C Referring: Wanita Chamberlain, PA-C Visit Date: 12/06/2022 Occupation: @GUAROCC @  Subjective:  No chief complaint on file.   History of Present Illness: Jeffery Mckee is a 47 y.o. male ***     Activities of Daily Living:  Patient reports morning stiffness for *** {minute/hour:19697}.   Patient {ACTIONS;DENIES/REPORTS:21021675::"Denies"} nocturnal pain.  Difficulty dressing/grooming: {ACTIONS;DENIES/REPORTS:21021675::"Denies"} Difficulty climbing stairs: {ACTIONS;DENIES/REPORTS:21021675::"Denies"} Difficulty getting out of chair: {ACTIONS;DENIES/REPORTS:21021675::"Denies"} Difficulty using hands for taps, buttons, cutlery, and/or writing: {ACTIONS;DENIES/REPORTS:21021675::"Denies"}  No Rheumatology ROS completed.   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   Benign neoplasm of rectosigmoid junction 07/17/2022   Family history of colon cancer 07/17/2022   Ureteral calculus 12/29/2021   Bilateral renal cysts 12/29/2021    Past Medical History:  Diagnosis Date   Arthritis    GERD (gastroesophageal reflux disease)    Nephrolithiasis    Obesity    RA (rheumatoid arthritis) ([place])     Family History  Problem Relation Age of Onset   Hypertension Mother    Osteoarthritis Mother    Kidney Stones Mother    Diabetes Mother    Gout Mother    Neuropathy Mother    Cancer Father    Hypertension Sister    Diabetes Sister    Past Surgical History:  Procedure Laterality Date   CHOLECYSTECTOMY     COLONOSCOPY WITH PROPOFOL N/A 07/17/2022   Procedure: COLONOSCOPY WITH PROPOFOL;  Surgeon: Rusty Aus, DO;  [facility];  Service: General;   Laterality: N/A;   HERNIA REPAIR     POLYPECTOMY  07/17/2022   Procedure: POLYPECTOMY INTESTINAL;  Surgeon: Rusty Aus, DO;  [facility];  Service: General;;   Social History   Social History Narrative   Not on file   Immunization History  Administered Date(s) Administered   Td 05/19/2014     Objective: Vital Signs: There were no vitals taken for this visit.   Physical Exam   Musculoskeletal Exam: ***  CDAI Exam: CDAI Score: -- Patient Global: --; Provider Global: -- Swollen: --; Tender: -- Joint Exam 12/06/2022   No joint exam has been documented for this visit   There is currently no information documented on the homunculus. Go to the Rheumatology activity and complete the homunculus joint exam.  Investigation: No additional findings.  Imaging: XR Foot 2 Views Left  Result Date: 11/08/2022 First MTP narrowing and first PIP narrowing was noted.  Dorsal spurring was noted.  No intertarsal tibiotalar joint space narrowing was noted.  Inferior and posterior calcaneal spurs were noted. Impression: These findings are consistent with osteoarthritis of the foot.  XR Foot 2 Views Right  Result Date: 11/08/2022 No MTP, PIP and DIP narrowing was noted.  Dorsal spurring with metatarsal tarsal joint narrowing was noted.  Inferior and posterior calcaneal spurs were noted. Impression: These findings are consistent with early degenerative changes.  XR Lumbar Spine 2-3 Views  Result Date: 11/08/2022 Multilevel spondylosis with anterior osteophytes was noted.  Most significant narrowing was noted between L3-L4.  Facet joint  arthropathy with foraminal narrowing was noted.  No SI joint narrowing or sclerosis was noted. Impression: These findings are consistent with multilevel spondylosis and facet joint arthropathy.    XR KNEE 3 VIEW LEFT  Result Date: 11/08/2022 No medial or lateral compartment narrowing was noted.  Intercondylar osteophytes were noted.  Mild  patellofemoral narrowing was noted.  No chondrocalcinosis was noted. Impression: These findings are suggestive of early degenerative changes and mild chondromalacia patella.  XR Hand 2 View Left  Result Date: 11/08/2022 CMC, PIP and DIP narrowing was noted.  No MCP, intercarpal or radiocarpal joint space narrowing was noted.  No erosive changes were noted. Impression: These findings are suggestive of osteoarthritis of the hand.  XR Hand 2 View Right  Result Date: 11/08/2022 CMC, PIP and DIP narrowing was noted.  No MCP, intercarpal or radiocarpal joint space narrowing was noted.  No erosive changes were noted. Impression: These findings are suggestive of osteoarthritis of the hand.   Recent Labs: Lab Results  Component Value Date   WBC 16.2 (H) 12/20/2021   HGB 15.2 12/20/2021   PLT 340 12/20/2021   NA 135 12/20/2021   K 3.4 (L) 12/20/2021   CL 102 12/20/2021   CO2 24 12/20/2021   GLUCOSE 108 (H) 12/20/2021   BUN 23 (H) 12/20/2021   CREATININE 1.22 12/20/2021   BILITOT 0.6 12/20/2021   ALKPHOS 100 12/20/2021   AST 21 12/20/2021   ALT 19 12/20/2021   PROT 8.2 (H) 12/20/2021   ALBUMIN 4.2 12/20/2021   CALCIUM 9.0 12/20/2021   GFRAA >60 09/07/2016    Speciality Comments: No specialty comments available.  Procedures:  No procedures performed Allergies: Penicillins and Morphine and related   Assessment / Plan:     Visit Diagnoses: No diagnosis found.  Orders: No orders of the defined types were placed in this encounter.  No orders of the defined types were placed in this encounter.   Face-to-face time spent with patient was *** minutes. Greater than 50% of time was spent in counseling and coordination of care.  Follow-Up Instructions: No follow-ups on file.   Bo Merino, MD  Note - This record has been created using Editor, commissioning.  Chart creation errors have been sought, but may not always  have been located. Such creation errors do not reflect on  the  standard of medical care.

## 2022-12-06 ENCOUNTER — Encounter: Payer: Self-pay | Admitting: Rheumatology

## 2022-12-06 ENCOUNTER — Ambulatory Visit: Payer: 59

## 2022-12-06 ENCOUNTER — Ambulatory Visit: Payer: 59 | Attending: Rheumatology | Admitting: Rheumatology

## 2022-12-06 VITALS — BP 120/75 | HR 76 | Resp 14 | Ht 66.0 in | Wt 239.0 lb

## 2022-12-06 DIAGNOSIS — M5136 Other intervertebral disc degeneration, lumbar region: Secondary | ICD-10-CM

## 2022-12-06 DIAGNOSIS — Z8261 Family history of arthritis: Secondary | ICD-10-CM | POA: Diagnosis not present

## 2022-12-06 DIAGNOSIS — F17210 Nicotine dependence, cigarettes, uncomplicated: Secondary | ICD-10-CM

## 2022-12-06 DIAGNOSIS — M19072 Primary osteoarthritis, left ankle and foot: Secondary | ICD-10-CM

## 2022-12-06 DIAGNOSIS — M19042 Primary osteoarthritis, left hand: Secondary | ICD-10-CM

## 2022-12-06 DIAGNOSIS — N201 Calculus of ureter: Secondary | ICD-10-CM | POA: Diagnosis not present

## 2022-12-06 DIAGNOSIS — M25562 Pain in left knee: Secondary | ICD-10-CM

## 2022-12-06 DIAGNOSIS — G8929 Other chronic pain: Secondary | ICD-10-CM

## 2022-12-06 DIAGNOSIS — M19071 Primary osteoarthritis, right ankle and foot: Secondary | ICD-10-CM | POA: Diagnosis not present

## 2022-12-06 DIAGNOSIS — M19041 Primary osteoarthritis, right hand: Secondary | ICD-10-CM | POA: Diagnosis not present

## 2022-12-06 DIAGNOSIS — B351 Tinea unguium: Secondary | ICD-10-CM | POA: Diagnosis not present

## 2022-12-06 DIAGNOSIS — M0579 Rheumatoid arthritis with rheumatoid factor of multiple sites without organ or systems involvement: Secondary | ICD-10-CM

## 2022-12-06 DIAGNOSIS — Z8719 Personal history of other diseases of the digestive system: Secondary | ICD-10-CM

## 2022-12-06 DIAGNOSIS — Z79899 Other long term (current) drug therapy: Secondary | ICD-10-CM | POA: Diagnosis not present

## 2022-12-06 DIAGNOSIS — Z800 Family history of malignant neoplasm of digestive organs: Secondary | ICD-10-CM | POA: Diagnosis not present

## 2022-12-06 DIAGNOSIS — F17200 Nicotine dependence, unspecified, uncomplicated: Secondary | ICD-10-CM

## 2022-12-06 DIAGNOSIS — R768 Other specified abnormal immunological findings in serum: Secondary | ICD-10-CM

## 2022-12-06 MED ORDER — HYDROXYCHLOROQUINE SULFATE 200 MG PO TABS: 200.0000 mg | ORAL_TABLET | Freq: Two times a day (BID) | ORAL | 2 refills | Status: DC

## 2022-12-06 MED ORDER — LIDOCAINE HCL 1 % IJ SOLN
1.5000 mL | INTRAMUSCULAR | Status: AC | PRN
  Administered 2022-12-06: 1.5 mL

## 2022-12-06 MED ORDER — TRIAMCINOLONE ACETONIDE 40 MG/ML IJ SUSP
40.0000 mg | INTRAMUSCULAR | Status: AC | PRN
  Administered 2022-12-06: 40 mg via INTRA_ARTICULAR

## 2022-12-06 NOTE — Patient Instructions (Addendum)
Hydroxychloroquine Tablets What is this medication? HYDROXYCHLOROQUINE (hye drox ee KLOR oh kwin) treats autoimmune conditions, such as rheumatoid arthritis and lupus. It works by slowing down an overactive immune system. It may also be used to prevent and treat malaria. It works by killing the parasite that causes malaria. It belongs to a group of medications called DMARDs. This medicine may be used for other purposes; ask your health care provider or pharmacist if you have questions. COMMON BRAND NAME(S): Plaquenil, Quineprox What should I tell my care team before I take this medication? They need to know if you have any of these conditions: Diabetes Eye disease, vision problems Frequently drink alcohol G6PD deficiency Heart disease Irregular heartbeat or rhythm Kidney disease Liver disease Porphyria Psoriasis An unusual or allergic reaction to hydroxychloroquine, other medications, foods, dyes, or preservatives Pregnant or trying to get pregnant Breastfeeding How should I use this medication? Take this medication by mouth with water. Take it as directed on the prescription label. Do not cut, crush, or chew this medication. Swallow the tablets whole. Take it with food. Do not take it more than directed. Take all of this medication unless your care team tells you to stop it early. Keep taking it even if you think you are better. Take products with antacids in them at a different time of day than this medication. Take this medication 4 hours before or 4 hours after antacids. Talk to your care team if you have questions. Talk to your care team about the use of this medication in children. While this medication may be prescribed for selected conditions, precautions do apply. Overdosage: If you think you have taken too much of this medicine contact a poison control center or emergency room at once. NOTE: This medicine is only for you. Do not share this medicine with others. What if I miss a  dose? If you miss a dose, take it as soon as you can. If it is almost time for your next dose, take only that dose. Do not take double or extra doses. What may interact with this medication? Do not take this medication with any of the following: Cisapride Dronedarone Pimozide Thioridazine This medication may also interact with the following: Ampicillin Antacids Cimetidine Cyclosporine Digoxin Kaolin Medications for diabetes, such as insulin, glipizide, glyburide Medications for seizures, such as carbamazepine, phenobarbital, phenytoin Mefloquine Methotrexate Other medications that cause heart rhythm changes Praziquantel This list may not describe all possible interactions. Give your health care provider a list of all the medicines, herbs, non-prescription drugs, or dietary supplements you use. Also tell them if you smoke, drink alcohol, or use illegal drugs. Some items may interact with your medicine. What should I watch for while using this medication? Visit your care team for regular checks on your progress. Tell your care team if your symptoms do not start to get better or if they get worse. You may need blood work done while you are taking this medication. If you take other medications that can affect heart rhythm, you may need more testing. Talk to your care team if you have questions. Your vision may be tested before and during use of this medication. Tell your care team right away if you have any change in your eyesight. This medication may cause serious skin reactions. They can happen weeks to months after starting the medication. Contact your care team right away if you notice fevers or flu-like symptoms with a rash. The rash may be red or purple and then turn   into blisters or peeling of the skin. Or, you might notice a red rash with swelling of the face, lips or lymph nodes in your neck or under your arms. If you or your family notice any changes in your behavior, such as new or  worsening depression, thoughts of harming yourself, anxiety, or other unusual or disturbing thoughts, or memory loss, call your care team right away. What side effects may I notice from receiving this medication? Side effects that you should report to your care team as soon as possible: Allergic reactions--skin rash, itching, hives, swelling of the face, lips, tongue, or throat Aplastic anemia--unusual weakness or fatigue, dizziness, headache, trouble breathing, increased bleeding or bruising Change in vision Heart rhythm changes--fast or irregular heartbeat, dizziness, feeling faint or lightheaded, chest pain, trouble breathing Infection--fever, chills, cough, or sore throat Low blood sugar (hypoglycemia)--tremors or shaking, anxiety, sweating, cold or clammy skin, confusion, dizziness, rapid heartbeat Muscle injury--unusual weakness or fatigue, muscle pain, dark yellow or brown urine, decrease in amount of urine Pain, tingling, or numbness in the hands or feet Rash, fever, and swollen lymph nodes Redness, blistering, peeling, or loosening of the skin, including inside the mouth Thoughts of suicide or self-harm, worsening mood, or feelings of depression Unusual bruising or bleeding Side effects that usually do not require medical attention (report to your care team if they continue or are bothersome): Diarrhea Headache Nausea Stomach pain Vomiting This list may not describe all possible side effects. Call your doctor for medical advice about side effects. You may report side effects to FDA at 1-800-FDA-1088. Where should I keep my medication? Keep out of the reach of children and pets. Store at room temperature up to 30 degrees C (86 degrees F). Protect from light. Get rid of any unused medication after the expiration date. To get rid of medications that are no longer needed or have expired: Take the medication to a medication take-back program. Check with your pharmacy or law enforcement  to find a location. If you cannot return the medication, check the label or package insert to see if the medication should be thrown out in the garbage or flushed down the toilet. If you are not sure, ask your care team. If it is safe to put it in the trash, empty the medication out of the container. Mix the medication with cat litter, dirt, coffee grounds, or other unwanted substance. Seal the mixture in a bag or container. Put it in the trash. NOTE: This sheet is a summary. It may not cover all possible information. If you have questions about this medicine, talk to your doctor, pharmacist, or health care provider.  2023 Elsevier/Gold Standard (2007-10-11 00:00:00)   Back Exercises The following exercises strengthen the muscles that help to support the trunk (torso) and back. They also help to keep the lower back flexible. Doing these exercises can help to prevent or lessen existing low back pain. If you have back pain or discomfort, try doing these exercises 2-3 times each day or as told by your health care provider. As your pain improves, do them once each day, but increase the number of times that you repeat the steps for each exercise (do more repetitions). To prevent the recurrence of back pain, continue to do these exercises once each day or as told by your health care provider. Do exercises exactly as told by your health care provider and adjust them as directed. It is normal to feel mild stretching, pulling, tightness, or discomfort as you  do these exercises, but you should stop right away if you feel sudden pain or your pain gets worse. Exercises Single knee to chest Repeat these steps 3-5 times for each leg: Lie on your back on a firm bed or the floor with your legs extended. Bring one knee to your chest. Your other leg should stay extended and in contact with the floor. Hold your knee in place by grabbing your knee or thigh with both hands and hold. Pull on your knee until you feel a  gentle stretch in your lower back or buttocks. Hold the stretch for 10-30 seconds. Slowly release and straighten your leg.  Pelvic tilt Repeat these steps 5-10 times: Lie on your back on a firm bed or the floor with your legs extended. Bend your knees so they are pointing toward the ceiling and your feet are flat on the floor. Tighten your lower abdominal muscles to press your lower back against the floor. This motion will tilt your pelvis so your tailbone points up toward the ceiling instead of pointing to your feet or the floor. With gentle tension and even breathing, hold this position for 5-10 seconds.  Cat-cow Repeat these steps until your lower back becomes more flexible: Get into a hands-and-knees position on a firm bed or the floor. Keep your hands under your shoulders, and keep your knees under your hips. You may place padding under your knees for comfort. Let your head hang down toward your chest. Contract your abdominal muscles and point your tailbone toward the floor so your lower back becomes rounded like the back of a cat. Hold this position for 5 seconds. Slowly lift your head, let your abdominal muscles relax, and point your tailbone up toward the ceiling so your back forms a sagging arch like the back of a cow. Hold this position for 5 seconds.  Press-ups Repeat these steps 5-10 times: Lie on your abdomen (face-down) on a firm bed or the floor. Place your palms near your head, about shoulder-width apart. Keeping your back as relaxed as possible and keeping your hips on the floor, slowly straighten your arms to raise the top half of your body and lift your shoulders. Do not use your back muscles to raise your upper torso. You may adjust the placement of your hands to make yourself more comfortable. Hold this position for 5 seconds while you keep your back relaxed. Slowly return to lying flat on the floor.  Bridges Repeat these steps 10 times: Lie on your back on a firm  bed or the floor. Bend your knees so they are pointing toward the ceiling and your feet are flat on the floor. Your arms should be flat at your sides, next to your body. Tighten your buttocks muscles and lift your buttocks off the floor until your waist is at almost the same height as your knees. You should feel the muscles working in your buttocks and the back of your thighs. If you do not feel these muscles, slide your feet 1-2 inches (2.5-5 cm) farther away from your buttocks. Hold this position for 3-5 seconds. Slowly lower your hips to the starting position, and allow your buttocks muscles to relax completely. If this exercise is too easy, try doing it with your arms crossed over your chest. Abdominal crunches Repeat these steps 5-10 times: Lie on your back on a firm bed or the floor with your legs extended. Bend your knees so they are pointing toward the ceiling and your feet are  flat on the floor. Cross your arms over your chest. Tip your chin slightly toward your chest without bending your neck. Tighten your abdominal muscles and slowly raise your torso high enough to lift your shoulder blades a tiny bit off the floor. Avoid raising your torso higher than that because it can put too much stress on your lower back and does not help to strengthen your abdominal muscles. Slowly return to your starting position.  Back lifts Repeat these steps 5-10 times: Lie on your abdomen (face-down) with your arms at your sides, and rest your forehead on the floor. Tighten the muscles in your legs and your buttocks. Slowly lift your chest off the floor while you keep your hips pressed to the floor. Keep the back of your head in line with the curve in your back. Your eyes should be looking at the floor. Hold this position for 3-5 seconds. Slowly return to your starting position.  Contact a health care provider if: Your back pain or discomfort gets much worse when you do an exercise. Your worsening back  pain or discomfort does not lessen within 2 hours after you exercise. If you have any of these problems, stop doing these exercises right away. Do not do them again unless your health care provider says that you can. Get help right away if: You develop sudden, severe back pain. If this happens, stop doing the exercises right away. Do not do them again unless your health care provider says that you can. This information is not intended to replace advice given to you by your health care provider. Make sure you discuss any questions you have with your health care provider. Document Revised: 02/14/2021 Document Reviewed: 11/02/2020 Elsevier Patient Education  [place] We placed an order today for your standing lab work.   Please have your standing labs drawn in 1 month, 3 months and then every 5 months  Please have your labs drawn 2 weeks prior to your appointment so that the provider can discuss your lab results at your appointment, if possible.  Please note that you may see your imaging and lab results in [facility] before we have reviewed them. We will contact you once all results are reviewed. Please allow our office up to 72 hours to thoroughly review all of the results before contacting the office for clarification of your results.  WALK-IN LAB HOURS  Monday through Thursday from 8:00 am -12:30 pm and 1:00 pm-5:00 pm and Friday from 8:00 am-12:00 pm.  Patients with office visits requiring labs will be seen before walk-in labs.  You may encounter longer than normal wait times. Please allow additional time. Wait times may be shorter on  Monday and Thursday afternoons.  We do not book appointments for walk-in labs. We appreciate your patience and understanding with our staff.   Labs are drawn by [facility]. Please bring your co-pay at the time of your lab draw.  You may receive a bill from [facility] for your lab work.  Please note if you are on Hydroxychloroquine and and an  order has been placed for a Hydroxychloroquine level,  you will need to have it drawn 4 hours or more after your last dose.  If you wish to have your labs drawn at another location, please call the office 24 hours in advance so we can fax the orders.  The office is located at [address] [place], [place], [place] [address]   If you have any questions regarding  directions or hours of operation,  please call [phone].   As a reminder, please drink plenty of water prior to coming for your lab work. Thanks!   Vaccines You are taking a medication(s) that can suppress your immune system.  The following immunizations are recommended: Flu annually Covid-19  Td/Tdap (tetanus, diphtheria, pertussis) every 10 years Pneumonia (Prevnar 15 then Pneumovax 23 at least 1 year apart.  Alternatively, can take Prevnar 20 without needing additional dose) Shingrix: 2 doses from 4 weeks to 6 months apart  Please check with your PCP to make sure you are up to date.

## 2022-12-26 IMAGING — DX DG ABDOMEN 1V
2 series · 2 of 2 positions shown · non-contrast
Comparison: CT abdomen/pelvis 12/21/2021

CLINICAL DATA: Left ureteral stone

EXAM:
ABDOMEN - 1 VIEW

[abdomen kub (1 of 2)]
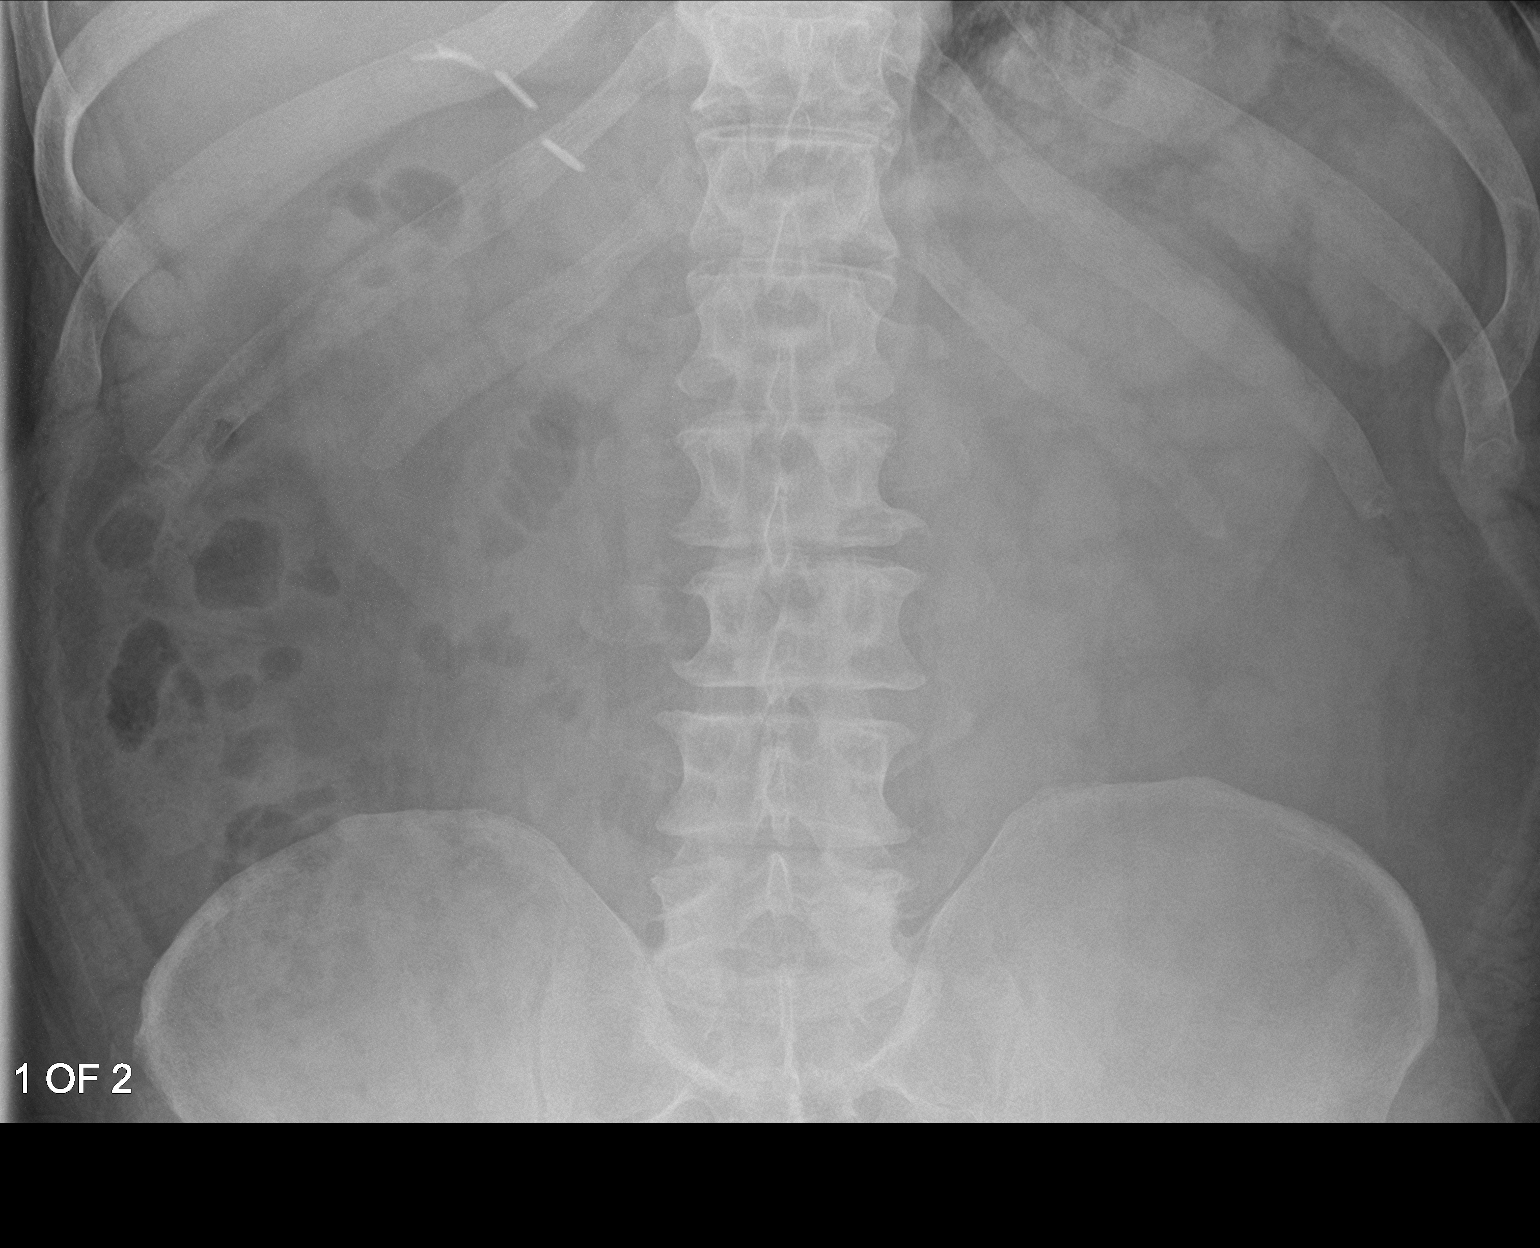

[abdomen kub (2 of 2)]
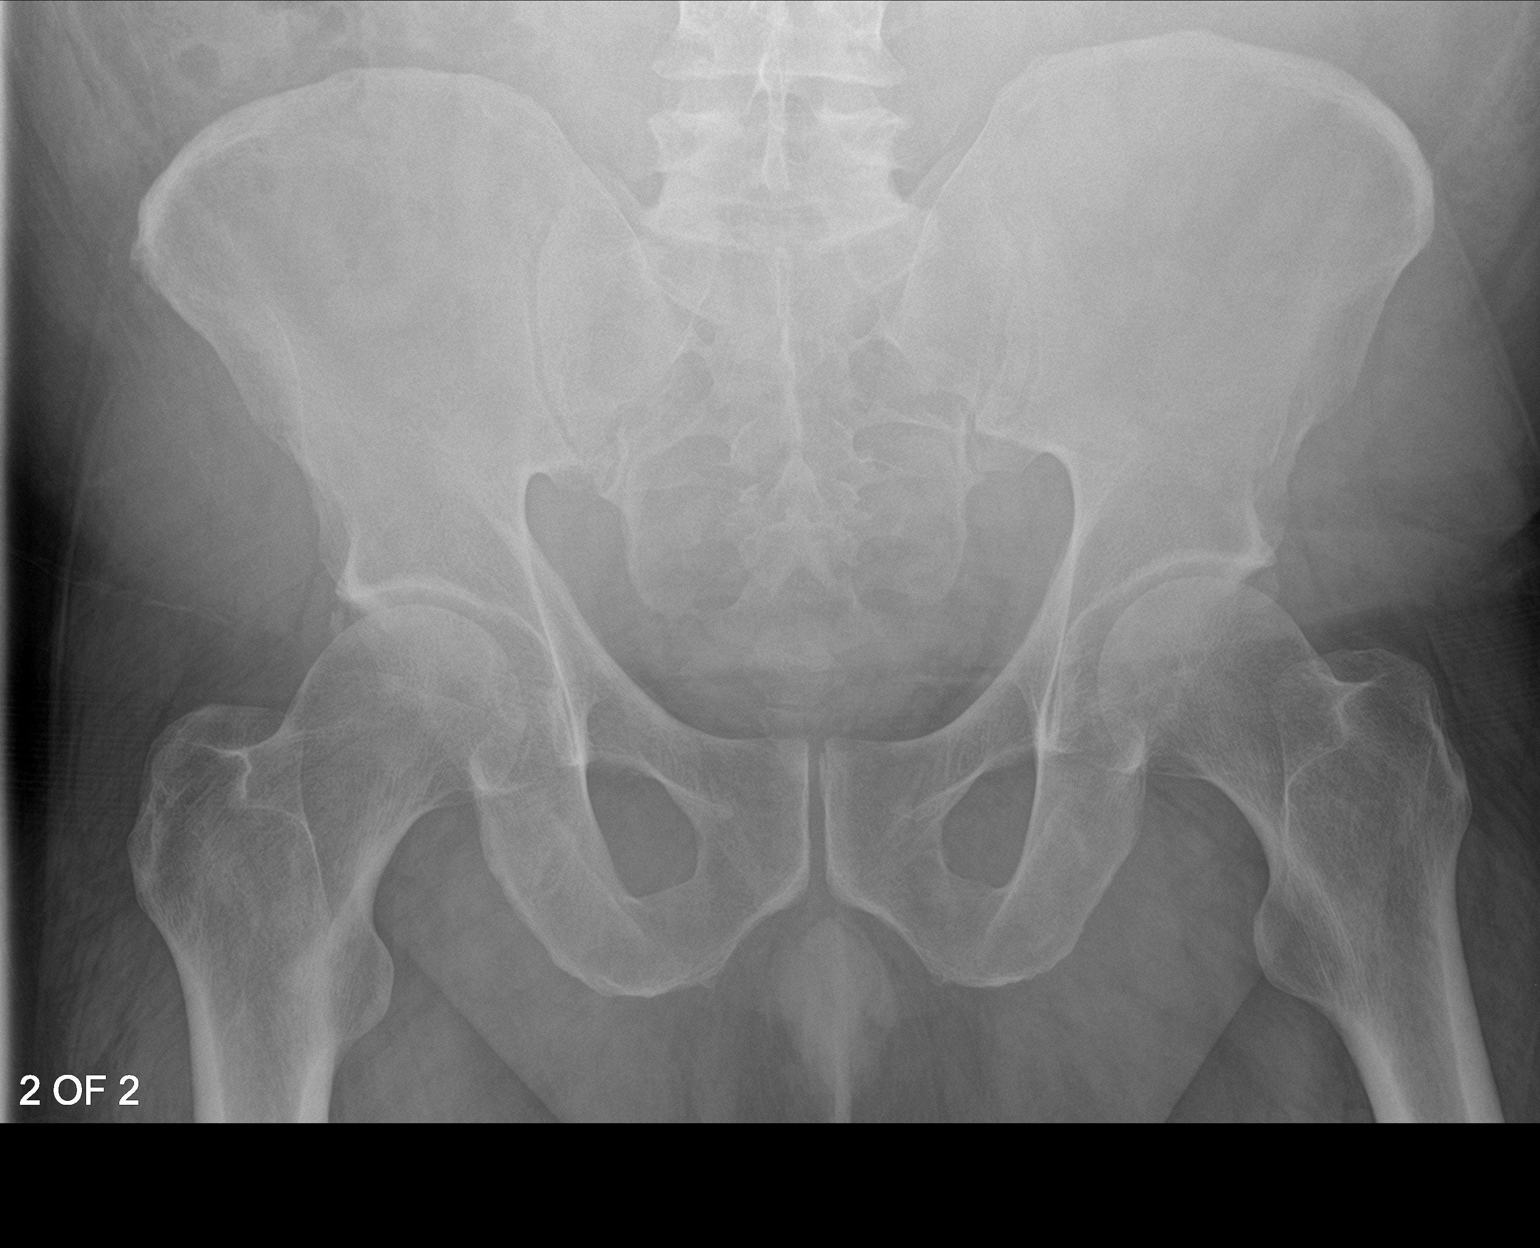

[2 of 2 positions shown; findings below may reference images not displayed]

FINDINGS: No definite renal or ureteral stones are seen. Specifically, the
distal left ureteral stone seen on the prior CT is not identified.

There is a nonobstructive bowel gas pattern. There is no other
abnormal soft tissue calcification. Right upper quadrant surgical
clips are noted. There is no acute osseous abnormality.
IMPRESSION: No definite renal or ureteral stones identified. Specifically, the
distal left ureteral stone seen on the prior CT is not definitely
seen.

## 2023-01-16 NOTE — Progress Notes (Signed)
Office Visit Note  Patient: Jeffery Mckee             Date of Birth: Jan 31, 1976           MRN: [id number]             PCP: Dion Saucier, PA-C Referring: Dion Saucier, PA-C Visit Date: 01/30/2023 Occupation: @GUAROCC @  Subjective:  Medication management  History of Present Illness: Jeffery Mckee is a 47 y.o. male with seropositive rheumatoid arthritis.  He was placed on hydroxychloroquine at the last visit.  He was also given a left knee joint cortisone injection at the last visit on December 06, 2022.  He states that he started taking hydroxychloroquine on December 06, 2022.  He went for a trip after that and walk for about 30 miles without any problems.  He continues to have some discomfort in his left knee joint.  The discomfort in his hands has resolved.  He denies any morning stiffness.  He has noticed overall improvement in his joints since he has been taking hydroxychloroquine.  He could not to schedule his baseline eye examination yet.    Activities of Daily Living:  Patient reports morning stiffness for 0 minutes.   Patient Denies nocturnal pain.  Difficulty dressing/grooming: Denies Difficulty climbing stairs: Denies Difficulty getting out of chair: Denies Difficulty using hands for taps, buttons, cutlery, and/or writing: Denies  Review of Systems  Constitutional:  Positive for fatigue.  HENT:  Negative for mouth sores and mouth dryness.   Eyes:  Negative for dryness.  Respiratory:  Negative for shortness of breath.   Cardiovascular:  Negative for chest pain and palpitations.  Gastrointestinal:  Negative for blood in stool, constipation and diarrhea.  Endocrine: Negative for increased urination.  Genitourinary:  Negative for involuntary urination.  Musculoskeletal:  Positive for joint pain and joint pain. Negative for gait problem, joint swelling, myalgias, muscle weakness, morning stiffness, muscle tenderness and myalgias.  Skin:  Positive for sensitivity to  sunlight. Negative for color change and rash.  Allergic/Immunologic: Negative for susceptible to infections.  Neurological:  Negative for dizziness and headaches.  Hematological:  Negative for swollen glands.  Psychiatric/Behavioral:  Positive for sleep disturbance. Negative for depressed mood. The patient is not nervous/anxious.     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/17/2022   Benign neoplasm of rectosigmoid junction 07/17/2022   Family history of colon cancer 07/17/2022   Ureteral calculus 12/29/2021   Bilateral renal cysts 12/29/2021    Past Medical History:  Diagnosis Date   Arthritis    GERD (gastroesophageal reflux disease)    Nephrolithiasis    Obesity    RA (rheumatoid arthritis) (HCC)     Family History  Problem Relation Age of Onset   Hypertension Mother    Osteoarthritis Mother    Kidney Stones Mother    Diabetes Mother    Gout Mother    Neuropathy Mother    Cancer Father    Hypertension Sister    Diabetes Sister    Past Surgical History:  Procedure Laterality Date   CHOLECYSTECTOMY     COLONOSCOPY WITH PROPOFOL N/A 07/17/2022   Procedure: COLONOSCOPY WITH PROPOFOL;  Surgeon: Lewie Chamber, DO;  Location: [facility];  Service: General;  Laterality: N/A;   HERNIA REPAIR     POLYPECTOMY  07/17/2022  Procedure: POLYPECTOMY INTESTINAL;  Surgeon: Lewie Chamber, DO;  Location: [facility];  Service: General;;   Social History   Social History Narrative   Not on file   Immunization History  Administered Date(s) Administered   Td 05/19/2014     Objective: Vital Signs: BP 135/84 (BP Location: Left Arm, Patient Position: Sitting, Cuff Size: Normal)   Pulse 73   Resp 18   Ht 5\' 6"  (1.676 m)   Wt 231 lb 6.4 oz (105 kg)   BMI 37.35 kg/m    Physical Exam Vitals and  nursing note reviewed.  Constitutional:      Appearance: He is well-developed.  HENT:     Head: Normocephalic and atraumatic.  Eyes:     Conjunctiva/sclera: Conjunctivae normal.     Pupils: Pupils are equal, round, and reactive to light.  Cardiovascular:     Rate and Rhythm: Normal rate and regular rhythm.     Heart sounds: Normal heart sounds.  Pulmonary:     Effort: Pulmonary effort is normal.     Breath sounds: Normal breath sounds.  Abdominal:     General: Bowel sounds are normal.     Palpations: Abdomen is soft.  Musculoskeletal:     Cervical back: Normal range of motion and neck supple.  Skin:    General: Skin is warm and dry.     Capillary Refill: Capillary refill takes less than 2 seconds.  Neurological:     Mental Status: He is alert and oriented to person, place, and time.  Psychiatric:        Behavior: Behavior normal.      Musculoskeletal Exam: He had good range of motion of the cervical thoracic and lumbar spine.  Shoulder joints and elbow joints were in good range of motion.  He had limited extension of his bilateral wrist joints.  No synovitis was noted.  There was no synovitis over MCP joints.  Bilateral PIP and DIP thickening with no synovitis was noted.  Hip joints and knee joints were in good range of motion without any warmth swelling or effusion.  There was no tenderness on ankles or MTPs.  CDAI Exam: CDAI Score: 1.6  Patient Global: 3 mm; Provider Global: 3 mm Swollen: 0 ; Tender: 1  Joint Exam 01/30/2023      Right  Left  Knee      Tender     Investigation: No additional findings.  Imaging: No results found.  Recent Labs: Lab Results  Component Value Date   WBC 16.2 (H) 12/20/2021   HGB 15.2 12/20/2021   PLT 340 12/20/2021   NA 135 12/20/2021   K 3.4 (L) 12/20/2021   CL 102 12/20/2021   CO2 24 12/20/2021   GLUCOSE 108 (H) 12/20/2021   BUN 23 (H) 12/20/2021   CREATININE 1.22 12/20/2021   BILITOT 0.6 12/20/2021   ALKPHOS 100  12/20/2021   AST 21 12/20/2021   ALT 19 12/20/2021   PROT 8.2 (H) 12/20/2021   ALBUMIN 4.2 12/20/2021   CALCIUM 9.0 12/20/2021   GFRAA >60 09/07/2016    Speciality Comments: PLQ 12/06/22  Procedures:  No procedures performed Allergies: Penicillins and Morphine and codeine   Assessment / Plan:     Visit Diagnoses: Rheumatoid arthritis involving multiple sites with positive rheumatoid factor (HCC) - RF 14, anti-CCP 176, ESR 18. -Patient was placed on hydroxychloroquine 200 mg p.o. twice daily at the last visit on December 06, 2022.  He noticed remarkable improvement in his symptoms  since then.  He states he does not have any morning stiffness or significant joint pain.  He noticed improvement in the discomfort in his wrist joints, bilateral hands, knee joints and his feet.  He states he is able to do more activities.  After his knee joint injection he went on a trip and walked about 30 miles.  He will continue hydroxychloroquine for now.  Will refer him to ophthalmology for baseline eye examination and then annual examination to monitor for ocular toxicity.  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  Association of anti-CCP with the smoking was discussed.  Smoking cessation was discussed.  High risk medication use - plaquenil 200mg  by mouth twice daily. -Will obtain CBC and CMP today.  Will check labs in 3 months again and then every 5 months.  Information on immunization was placed in the AVS.  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  He had bilateral PIP and DIP thickening.  He has some discomfort in his hands due to underlying osteoarthritis.  Chronic pain of left knee -left knee was injected at the last visit.  He had improvement in his left knee joint discomfort after the injection.  He still continues to have some discomfort in his left knee joint.  No warmth  swelling or effusion was noted.  Lower extremity muscle strength exercises were discussed.  X-rays showed early degenerative changes and mild chondromalacia patella.  Primary osteoarthritis of both feet -he denies any discomfort in his feet today.  He noted improvement in the feet discomfort since he has been on hydroxychloroquine.  X-rays obtained were consistent with osteoarthritis.  DDD (degenerative disc disease), lumbar -he has intermittent lower back pain.  Core strengthening exercises were emphasized.  Other medical problems are listed as follows: X-rays showed multilevel spondylosis and facet joint arthropathy.  Family history of rheumatoid arthritis-mother  Family history of colon cancer-father  Onychomycosis  Ureteral calculus  History of gastroesophageal reflux (GERD)  Smoker - He smokes 2 packs/day for 25 years.  Smoking cessation was discussed.  Association of smoking with rheumatoid arthritis was discussed.  Orders: Orders Placed This Encounter  Procedures   CBC with Differential/Platelet   COMPLETE METABOLIC PANEL WITH GFR   Ambulatory referral to Ophthalmology   No orders of the defined types were placed in this encounter.    Follow-Up Instructions: Return in about 3 months (around 05/02/2023) for Rheumatoid arthritis.   Pollyann Savoy, MD  Note - This record has been created using Animal nutritionist.  Chart creation errors have been sought, but may not always  have been located. Such creation errors do not reflect on  the standard of medical care.

## 2023-01-19 IMAGING — CT CT RENAL STONE PROTOCOL
1 of 2 series · 15 of 32 positions shown, 19 images · non-contrast
Comparison: 12/21/2021

CLINICAL DATA: Follow-up left ureteral calculus and hydronephrosis.



[Series 2: renal stone 5.0 · axial · 0.98mm/px · z∈[-487,-47]mm · 15 of 97 slices shown, 19 images]
[im 5/97  soft-tissue]
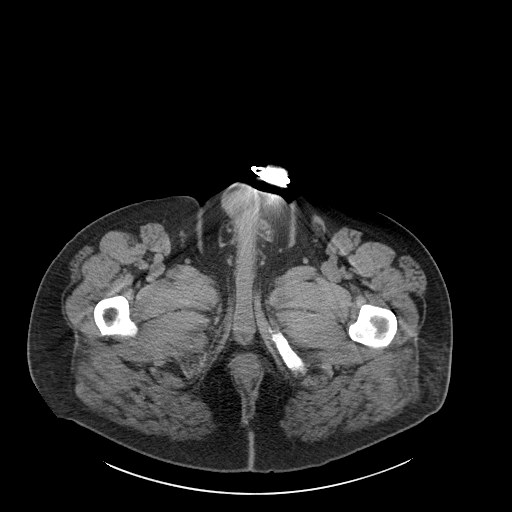
[im 5/97  bone]
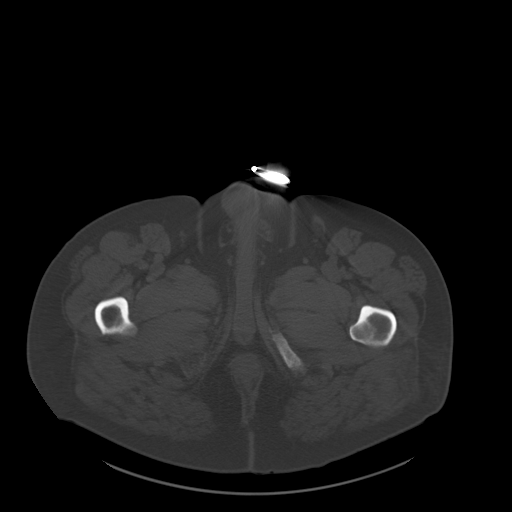
[im 13/97  soft-tissue]
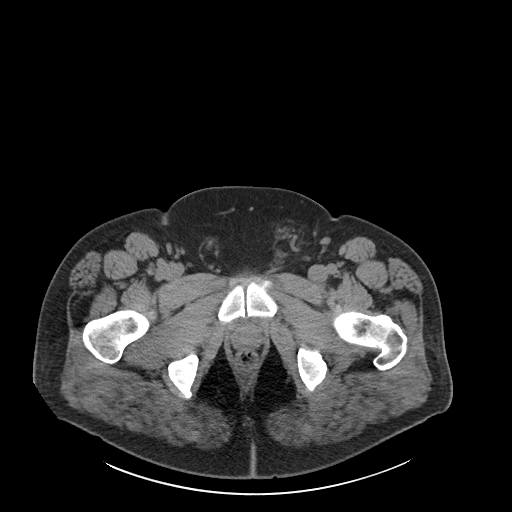
[im 21/97  soft-tissue]
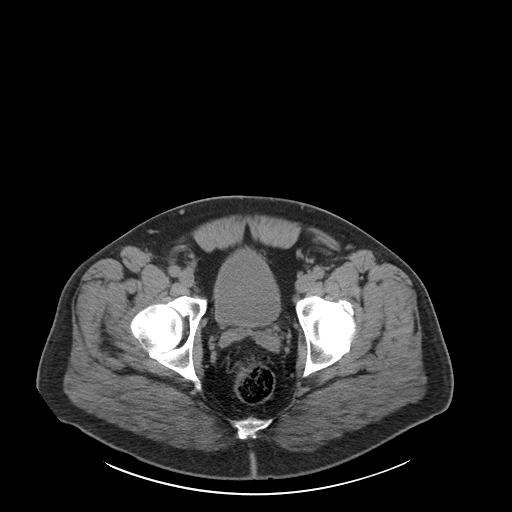
[im 29/97  soft-tissue]
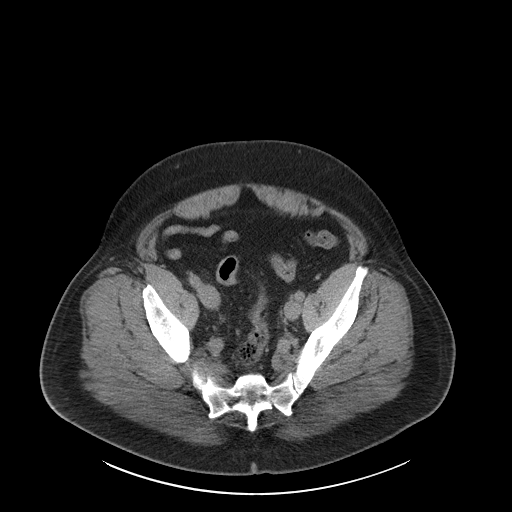
[im 33/97  soft-tissue]
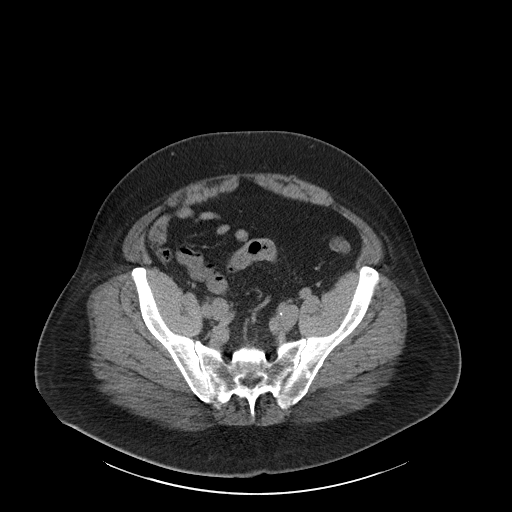
[im 41/97  soft-tissue]
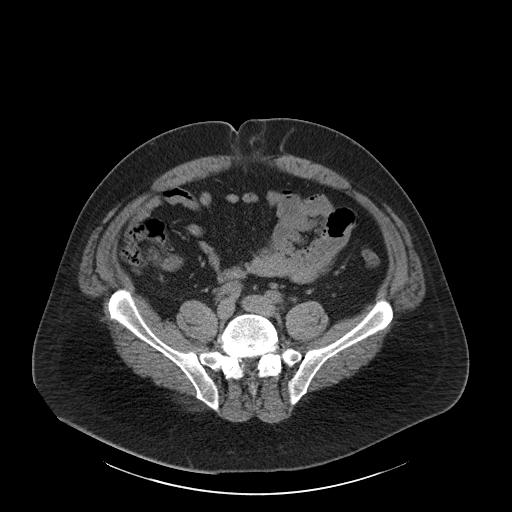
[im 49/97  soft-tissue]
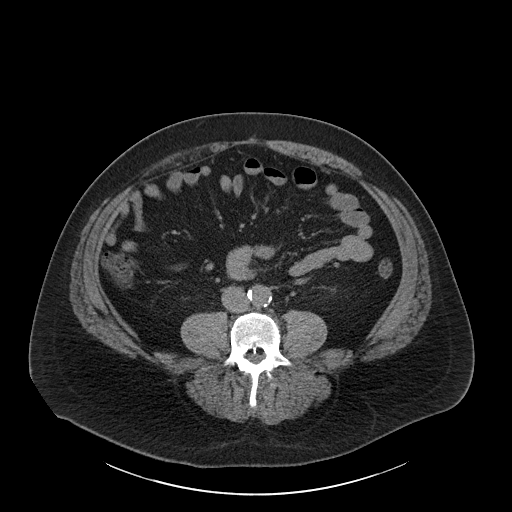
[im 57/97  soft-tissue]
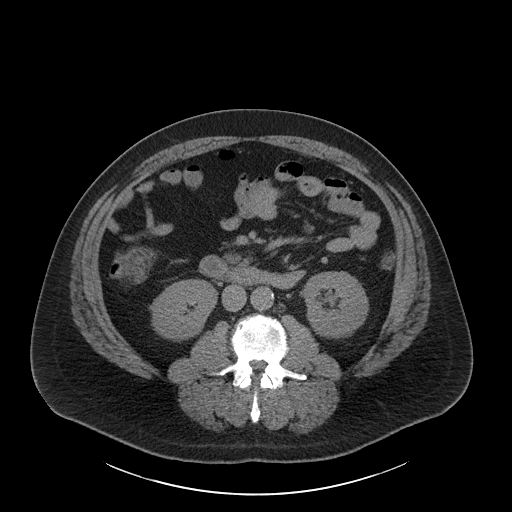
[im 65/97  soft-tissue]
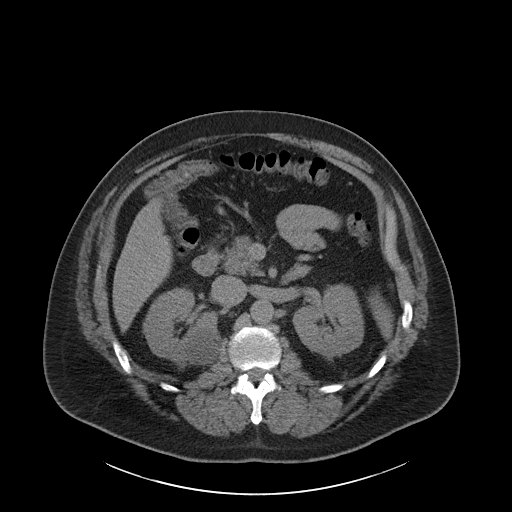
[im 65/97  bone]
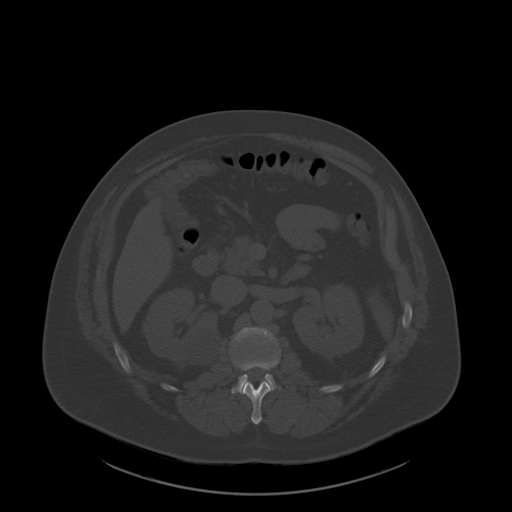
[im 69/97  soft-tissue]
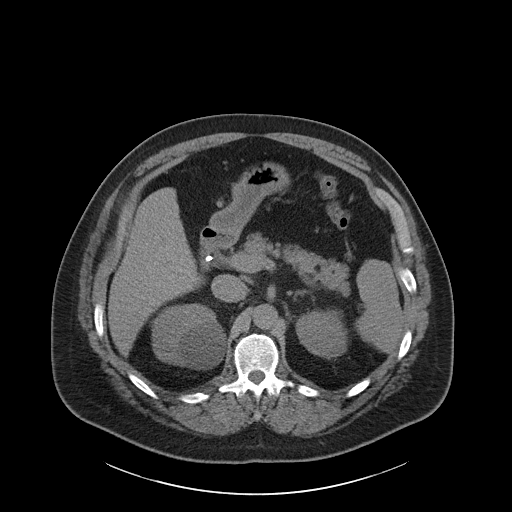
[im 77/97  soft-tissue]
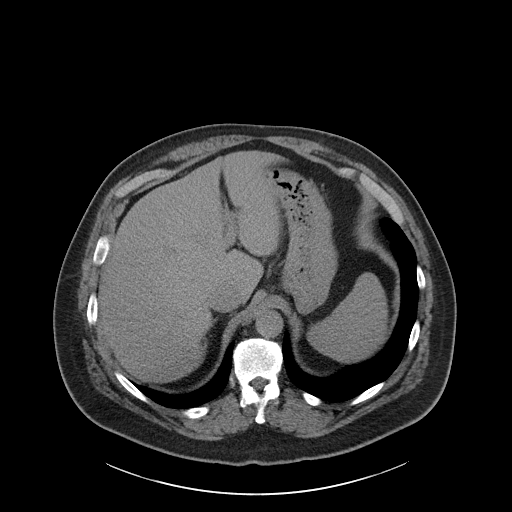
[im 81/97  lung]
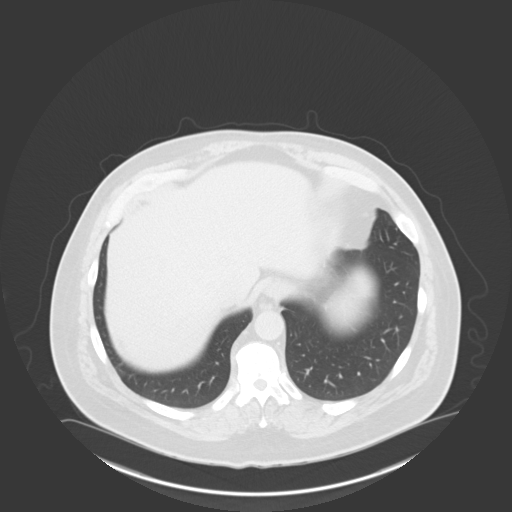
[im 85/97  soft-tissue]
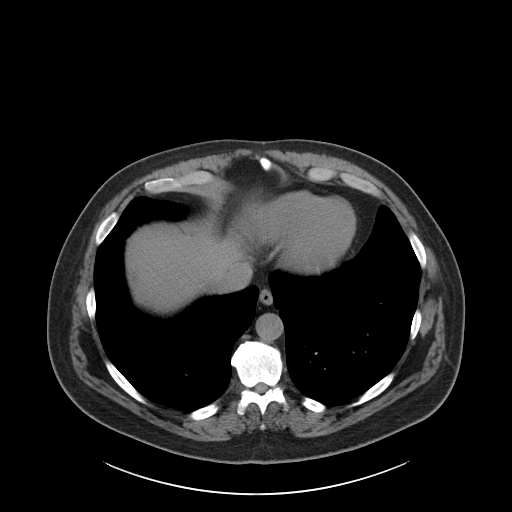
[im 85/97  lung]
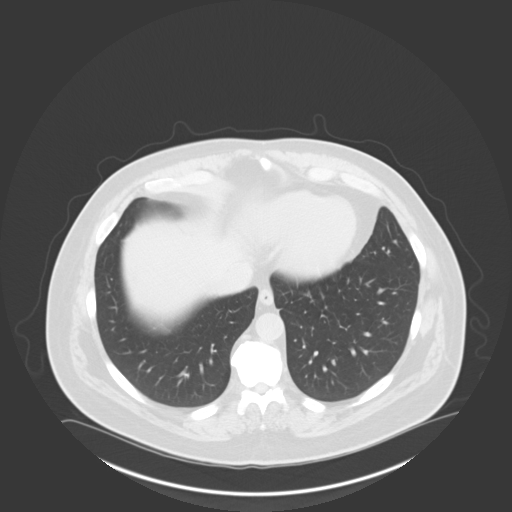
[im 89/97  lung]
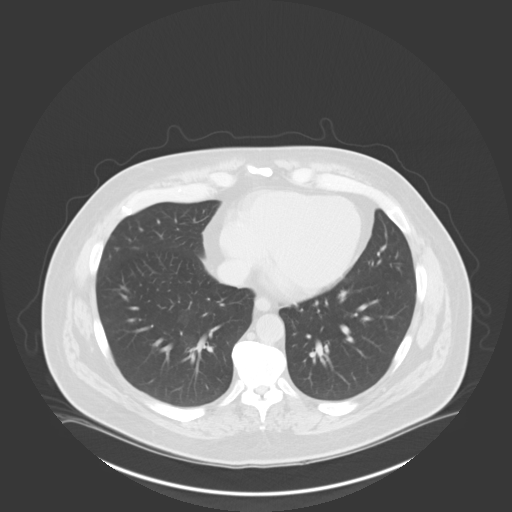
[im 93/97  soft-tissue]
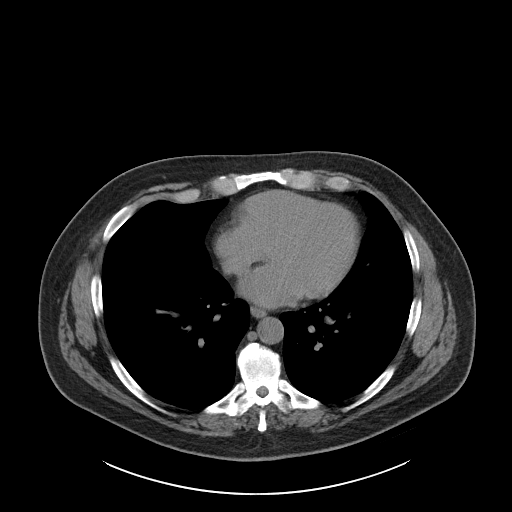
[im 93/97  lung]
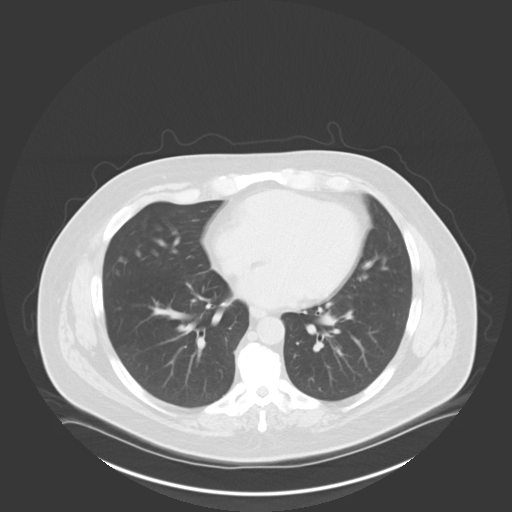

[15 of 32 positions shown; findings below may reference images not displayed]

FINDINGS: Lower chest: No acute findings.

Hepatobiliary: No mass visualized on this unenhanced exam. Prior
cholecystectomy. No evidence of biliary obstruction.

Pancreas: No mass or inflammatory process visualized on this
unenhanced exam.

Spleen:  Within normal limits in size.

Adrenals/Urinary tract: No evidence of urolithiasis or
hydronephrosis. Previously seen left ureteral calculus is no longer
present. Unremarkable unopacified urinary bladder. 4 cm fluid
attenuation right renal cyst is unchanged (no followup imaging
recommended).

Stomach/Bowel: No evidence of obstruction, inflammatory process, or
abnormal fluid collections. Normal appendix visualized.

Vascular/Lymphatic: No pathologically enlarged lymph nodes
identified. No evidence of abdominal aortic aneurysm. Aortic
atherosclerotic calcification incidentally noted.

Reproductive:  No mass or other significant abnormality.

Other:  Stable small umbilical hernia, which contains only fat.

Musculoskeletal:  No suspicious bone lesions identified.
IMPRESSION: Previously seen left ureteral calculus is no longer present. No
evidence of urolithiasis, hydronephrosis, or other acute findings.

Stable small umbilical hernia, which contains only fat.

## 2023-01-25 ENCOUNTER — Telehealth: Payer: Self-pay | Admitting: Rheumatology

## 2023-01-25 NOTE — Telephone Encounter (Signed)
Patient left a voicemail stating he is scheduled for an appointment on Wednesday, 01/30/23, but he forgot to get his eye exam.  Patient requested a return call to let him know if he needs to reschedule.

## 2023-01-25 NOTE — Telephone Encounter (Signed)
Returned call to patient. Patient advised he does not need to have his eye exam prior to his follow up visit. Patient advised his follow up appointment is to check in with him to see how he is responding to the medication. Patient advised to go ahead and schedule the appointment for his eye exam. Patient expressed understanding.

## 2023-01-30 ENCOUNTER — Encounter: Payer: Self-pay | Admitting: Rheumatology

## 2023-01-30 ENCOUNTER — Ambulatory Visit: Payer: 59 | Attending: Rheumatology | Admitting: Rheumatology

## 2023-01-30 VITALS — BP 135/84 | HR 73 | Resp 18 | Ht 66.0 in | Wt 231.4 lb

## 2023-01-30 DIAGNOSIS — M19041 Primary osteoarthritis, right hand: Secondary | ICD-10-CM

## 2023-01-30 DIAGNOSIS — M19042 Primary osteoarthritis, left hand: Secondary | ICD-10-CM

## 2023-01-30 DIAGNOSIS — M51369 Other intervertebral disc degeneration, lumbar region without mention of lumbar back pain or lower extremity pain: Secondary | ICD-10-CM

## 2023-01-30 DIAGNOSIS — M19071 Primary osteoarthritis, right ankle and foot: Secondary | ICD-10-CM | POA: Diagnosis not present

## 2023-01-30 DIAGNOSIS — B351 Tinea unguium: Secondary | ICD-10-CM

## 2023-01-30 DIAGNOSIS — M5136 Other intervertebral disc degeneration, lumbar region: Secondary | ICD-10-CM

## 2023-01-30 DIAGNOSIS — N201 Calculus of ureter: Secondary | ICD-10-CM | POA: Diagnosis not present

## 2023-01-30 DIAGNOSIS — G8929 Other chronic pain: Secondary | ICD-10-CM

## 2023-01-30 DIAGNOSIS — R768 Other specified abnormal immunological findings in serum: Secondary | ICD-10-CM

## 2023-01-30 DIAGNOSIS — M0579 Rheumatoid arthritis with rheumatoid factor of multiple sites without organ or systems involvement: Secondary | ICD-10-CM | POA: Diagnosis not present

## 2023-01-30 DIAGNOSIS — Z79899 Other long term (current) drug therapy: Secondary | ICD-10-CM

## 2023-01-30 DIAGNOSIS — Z8719 Personal history of other diseases of the digestive system: Secondary | ICD-10-CM | POA: Diagnosis not present

## 2023-01-30 DIAGNOSIS — M25562 Pain in left knee: Secondary | ICD-10-CM | POA: Diagnosis not present

## 2023-01-30 DIAGNOSIS — F17200 Nicotine dependence, unspecified, uncomplicated: Secondary | ICD-10-CM

## 2023-01-30 DIAGNOSIS — M19072 Primary osteoarthritis, left ankle and foot: Secondary | ICD-10-CM

## 2023-01-30 DIAGNOSIS — Z800 Family history of malignant neoplasm of digestive organs: Secondary | ICD-10-CM

## 2023-01-30 DIAGNOSIS — Z8261 Family history of arthritis: Secondary | ICD-10-CM | POA: Diagnosis not present

## 2023-01-30 NOTE — Patient Instructions (Signed)
Standing Labs We placed an order today for your standing lab work.   Please have your standing labs drawn in  August and then every 5 months  Please have your labs drawn 2 weeks prior to your appointment so that the provider can discuss your lab results at your appointment, if possible.  Please note that you may see your imaging and lab results in MyChart before we have reviewed them. We will contact you once all results are reviewed. Please allow our office up to 72 hours to thoroughly review all of the results before contacting the office for clarification of your results.  WALK-IN LAB HOURS  Monday through Thursday from 8:00 am -12:30 pm and 1:00 pm-5:00 pm and Friday from 8:00 am-12:00 pm.  Patients with office visits requiring labs will be seen before walk-in labs.  You may encounter longer than normal wait times. Please allow additional time. Wait times may be shorter on  Monday and Thursday afternoons.  We do not book appointments for walk-in labs. We appreciate your patience and understanding with our staff.   Labs are drawn by [facility]. Please bring your co-pay at the time of your lab draw.  You may receive a bill from [facility] for your lab work.  Please note if you are on Hydroxychloroquine and and an order has been placed for a Hydroxychloroquine level,  you will need to have it drawn 4 hours or more after your last dose.  If you wish to have your labs drawn at another location, please call the office 24 hours in advance so we can fax the orders.  The office is located at [address], [address], [place], [place] [address]   If you have any questions regarding directions or hours of operation,  please call [phone].   As a reminder, please drink plenty of water prior to coming for your lab work. Thanks!   Vaccines You are taking a medication(s) that can suppress your immune system.  The following immunizations are recommended: Flu annually Covid-19  Td/Tdap (tetanus,  diphtheria, pertussis) every 10 years Pneumonia (Prevnar 15 then Pneumovax 23 at least 1 year apart.  Alternatively, can take Prevnar 20 without needing additional dose) Shingrix: 2 doses from 4 weeks to 6 months apart  Please check with your PCP to make sure you are up to date.

## 2023-01-31 LAB — CBC WITH DIFFERENTIAL/PLATELET
Absolute Monocytes: 774 cells/uL (ref 200–950)
Basophils Absolute: 46 cells/uL (ref 0–200)
Basophils Relative: 0.5 %
Eosinophils Absolute: 164 cells/uL (ref 15–500)
Eosinophils Relative: 1.8 %
HCT: 47.3 % (ref 38.5–50.0)
Hemoglobin: 16.2 g/dL (ref 13.2–17.1)
Lymphs Abs: 1219 cells/uL (ref 850–3900)
MCH: 31.8 pg (ref 27.0–33.0)
MCHC: 34.2 g/dL (ref 32.0–36.0)
MCV: 92.7 fL (ref 80.0–100.0)
MPV: 9.6 fL (ref 7.5–12.5)
Monocytes Relative: 8.5 %
Neutro Abs: 6898 cells/uL (ref 1500–7800)
Neutrophils Relative %: 75.8 %
Platelets: 332 10*3/uL (ref 140–400)
RBC: 5.1 10*6/uL (ref 4.20–5.80)
RDW: 12 % (ref 11.0–15.0)
Total Lymphocyte: 13.4 %
WBC: 9.1 10*3/uL (ref 3.8–10.8)

## 2023-01-31 LAB — COMPLETE METABOLIC PANEL WITH GFR
AG Ratio: 1.5 (calc) (ref 1.0–2.5)
ALT: 21 U/L (ref 9–46)
AST: 16 U/L (ref 10–40)
Albumin: 4.5 g/dL (ref 3.6–5.1)
Alkaline phosphatase (APISO): 109 U/L (ref 36–130)
BUN: 20 mg/dL (ref 7–25)
CO2: 28 mmol/L (ref 20–32)
Calcium: 9.5 mg/dL (ref 8.6–10.3)
Chloride: 103 mmol/L (ref 98–110)
Creat: 0.76 mg/dL (ref 0.60–1.29)
Globulin: 3.1 g/dL (calc) (ref 1.9–3.7)
Glucose, Bld: 89 mg/dL (ref 65–99)
Potassium: 4 mmol/L (ref 3.5–5.3)
Sodium: 139 mmol/L (ref 135–146)
Total Bilirubin: 0.6 mg/dL (ref 0.2–1.2)
Total Protein: 7.6 g/dL (ref 6.1–8.1)
eGFR: 112 mL/min/{1.73_m2} (ref >=60)

## 2023-01-31 NOTE — Progress Notes (Signed)
CBC and CMP are normal.

## 2023-02-27 ENCOUNTER — Other Ambulatory Visit: Payer: Self-pay | Admitting: Rheumatology

## 2023-02-27 NOTE — Telephone Encounter (Signed)
Last Fill: 12/06/2022  Eye exam:  not on file  Labs: 01/30/2023 CBC and CMP are normal.   Next Visit: 05/02/2023  Last Visit: 01/30/2023  [id number] arthritis involving multiple sites with positive rheumatoid factor   Current Dose per office note 01/30/2023: plaquenil 200mg  by mouth twice daily   Left message to advise patient we are needing his PLQ eye exam. Advised patient to call back with the date he has his eye exam scheduled.   Okay to refill Plaquenil?

## 2023-03-20 ENCOUNTER — Telehealth: Payer: Self-pay | Admitting: *Deleted

## 2023-03-20 NOTE — Telephone Encounter (Signed)
Patient contacted the office regarding an appointment with an eye doctor. We referred patient to [facility]. Patient had an appointment scheduled and missed the appointment due to being out of town. Patient states he has tried on multiple occasions to reach out to [facility] to reschedule his appointment but has been unable to reach anyone. Patient advised that he would need to call around to different ophthalmology office to see who can see him the soonest. Patient advised once he has had his eye exam and we receive results we can refill the PLQ.

## 2023-03-25 DIAGNOSIS — M05731 Rheumatoid arthritis with rheumatoid factor of right wrist without organ or systems involvement: Secondary | ICD-10-CM | POA: Diagnosis not present

## 2023-03-25 DIAGNOSIS — H2513 Age-related nuclear cataract, bilateral: Secondary | ICD-10-CM | POA: Diagnosis not present

## 2023-03-25 DIAGNOSIS — Z79899 Other long term (current) drug therapy: Secondary | ICD-10-CM | POA: Diagnosis not present

## 2023-03-26 ENCOUNTER — Other Ambulatory Visit: Payer: Self-pay | Admitting: *Deleted

## 2023-03-26 MED ORDER — HYDROXYCHLOROQUINE SULFATE 200 MG PO TABS: 200.0000 mg | ORAL_TABLET | Freq: Two times a day (BID) | ORAL | 2 refills | Status: DC

## 2023-03-26 NOTE — Telephone Encounter (Signed)
Last Fill: 02/27/2023 (30 day supply)  Eye exam: 03/25/2023 WNL   Labs:  01/30/2023 CBC and CMP are normal.   Next Visit: 05/02/2023  Last Visit: 01/30/2023  DX: Rheumatoid arthritis involving multiple sites with positive rheumatoid factor   Current Dose per office note 01/30/2023:  plaquenil 200mg  by mouth twice daily   Okay to refill Plaquenil?

## 2023-04-18 NOTE — Progress Notes (Unsigned)
Office Visit Note  Patient: Jeffery Mckee             Date of Birth: May 13, 1976           MRN: [id number]             PCP: Dion Saucier, PA-C Referring: Dion Saucier, PA-C Visit Date: 05/02/2023 Occupation: @GUAROCC @  Subjective:  Knee pain  History of Present Illness: Jeffery Mckee is a 47 y.o. male with history of seropositive rheumatoid arthritis and osteoarthritis.  Patient is taking plaquenil 200mg  by mouth twice daily-started in April 2024.  Patient has been tolerating Plaquenil without any side effects and has not missed any doses recently.  Overall he has noticed a significant improvement in his symptoms since initiating Plaquenil.  He has started to have some recurrence of knee joint pain denies any warmth or swelling.  He has not been taking any over-the-counter products for pain relief.   Activities of Daily Living:  Patient reports morning stiffness for 30-40 minutes  Patient Denies nocturnal pain.  Difficulty dressing/grooming: Denies Difficulty climbing stairs: Reports Difficulty getting out of chair: Denies Difficulty using hands for taps, buttons, cutlery, and/or writing: Denies  Review of Systems  Constitutional:  Positive for fatigue. Negative for night sweats.  HENT:  Negative for mouth sores, mouth dryness and nose dryness.   Eyes:  Negative for redness and dryness.  Respiratory:  Negative for cough, hemoptysis, shortness of breath and difficulty breathing.   Cardiovascular:  Negative for chest pain, palpitations, hypertension, irregular heartbeat and swelling in legs/feet.  Gastrointestinal:  Positive for diarrhea. Negative for blood in stool and constipation.  Endocrine: Negative for increased urination.  Genitourinary:  Negative for painful urination.  Musculoskeletal:  Positive for joint pain and joint pain. Negative for joint swelling, myalgias, muscle weakness, morning stiffness, muscle tenderness and myalgias.  Skin:  Negative for color  change, rash, hair loss, nodules/bumps, skin tightness, ulcers and sensitivity to sunlight.  Allergic/Immunologic: Negative for susceptible to infections.  Neurological:  Negative for dizziness, fainting, memory loss, night sweats and weakness.  Hematological:  Negative for swollen glands.  Psychiatric/Behavioral:  Positive for sleep disturbance. Negative for depressed mood. The patient is not nervous/anxious.     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/17/2022   Benign neoplasm of rectosigmoid junction 07/17/2022   Family history of colon cancer 07/17/2022   Ureteral calculus 12/29/2021   Bilateral renal cysts 12/29/2021    Past Medical History:  Diagnosis Date   Arthritis    GERD (gastroesophageal reflux disease)    Nephrolithiasis    Obesity    RA (rheumatoid arthritis) (HCC)     Family History  Problem Relation Age of Onset   Hypertension Mother    Osteoarthritis Mother    Kidney Stones Mother    Diabetes Mother    Gout Mother    Neuropathy Mother    Cancer Father    Hypertension Sister    Diabetes Sister    Past Surgical History:  Procedure Laterality Date   CHOLECYSTECTOMY     COLONOSCOPY WITH PROPOFOL N/A 07/17/2022   Procedure: COLONOSCOPY WITH PROPOFOL;  Surgeon: Lewie Chamber, DO;  Location: [facility];  Service: General;  Laterality: N/A;   HERNIA REPAIR     POLYPECTOMY  07/17/2022   Procedure: POLYPECTOMY INTESTINAL;  Surgeon:  Pappayliou, Gustavus Messing, DO;  Location: [facility];  Service: General;;   Social History   Social History Narrative   Not on file   Immunization History  Administered Date(s) Administered   Td 05/19/2014     Objective: Vital Signs: BP 122/80 (BP Location: Left Arm, Patient Position: Sitting, Cuff Size: Large)   Pulse (!) 54   Ht 5\' 6"  (1.676 m)   Wt 232  lb 9.6 oz (105.5 kg)   BMI 37.54 kg/m    Physical Exam Vitals and nursing note reviewed.  Constitutional:      Appearance: He is well-developed.  HENT:     Head: Normocephalic and atraumatic.  Eyes:     Conjunctiva/sclera: Conjunctivae normal.     Pupils: Pupils are equal, round, and reactive to light.  Cardiovascular:     Rate and Rhythm: Normal rate and regular rhythm.     Heart sounds: Normal heart sounds.  Pulmonary:     Effort: Pulmonary effort is normal.     Breath sounds: Normal breath sounds.  Abdominal:     General: Bowel sounds are normal.     Palpations: Abdomen is soft.  Musculoskeletal:     Cervical back: Normal range of motion and neck supple.  Skin:    General: Skin is warm and dry.     Capillary Refill: Capillary refill takes less than 2 seconds.  Neurological:     Mental Status: He is alert and oriented to person, place, and time.  Psychiatric:        Behavior: Behavior normal.      Musculoskeletal Exam: C-spine has good range of motion.  Painful range of motion of the lumbar spine.  Shoulder joints and elbow joints have good ROM.  Limited range of motion of both wrist joints.  No synovitis over MCP joints.  PIP and DIP thickening noted.  Hip joints have good range of motion with no groin pain.  Some discomfort with range of motion of both knee joints.  No warmth or effusion of knee joints noted.  Ankle joints have good range of motion with no tenderness or joint swelling.  CDAI Exam: CDAI Score: -- Patient Global: --; Provider Global: -- Swollen: --; Tender: -- Joint Exam 05/02/2023   No joint exam has been documented for this visit   There is currently no information documented on the homunculus. Go to the Rheumatology activity and complete the homunculus joint exam.  Investigation: No additional findings.  Imaging: No results found.  Recent Labs: Lab Results  Component Value Date   WBC 9.1 01/30/2023   HGB 16.2 01/30/2023   PLT 332  01/30/2023   NA 139 01/30/2023   K 4.0 01/30/2023   CL 103 01/30/2023   CO2 28 01/30/2023   GLUCOSE 89 01/30/2023   BUN 20 01/30/2023   CREATININE 0.76 01/30/2023   BILITOT 0.6 01/30/2023   ALKPHOS 100 12/20/2021   AST 16 01/30/2023   ALT 21 01/30/2023   PROT 7.6 01/30/2023   ALBUMIN 4.2 12/20/2021   CALCIUM 9.5 01/30/2023   GFRAA >60 09/07/2016    Speciality Comments: PLQ Eye Exam: 03/25/2023 WNL @ [facility] Follow up in 1 year   Procedures:  No procedures performed Allergies: Penicillins and Morphine and codeine     Assessment / Plan:     Visit Diagnoses: Rheumatoid arthritis involving multiple sites with positive rheumatoid factor (HCC) - RF 14, anti-CCP 176, ESR 18: He has no synovitis on examination today.  He has  clinically been doing well taking Plaquenil 200 mg 1 tablet by mouth twice daily.  He is tolerating Plaquenil without any side effects and has not missed any doses recently.  Patient states that he tried reducing Plaquenil to once daily but noticed some increased arthralgias and has gone back up to twice daily.  He has noticed intermittent discomfort in both knee joints especially if he is not wearing proper fitting shoes.  On examination today he has good range of motion of both knee joints with mild discomfort but no warmth or effusion.  Discussed the use of Voltaren gel which she can apply topically for pain relief as well as the use of a compression sleeve for support. He will remain on Plaquenil as monotherapy.  He was advised notify us if he develops signs or symptoms of a flare.  He will follow-up in the office in 3 months or sooner if needed.  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 Family history of RA-mother.   High risk medication use - Plaquenil 200mg  by mouth twice daily-started in April 2024.  CBC and CMP updated on 01/30/23. Orders for CBC and CMP released today. His next lab work will be due in 5 months.   PLQ Eye Exam: 03/25/2023 WNL @ [facility]  Follow up in 1 year   - Plan: CBC with Differential/Platelet, COMPLETE METABOLIC PANEL WITH GFR  Primary osteoarthritis of both hands: PIP and DIP thickening consistent with osteoarthritis of both hands.  No synovitis noted.  Chronic pain of left knee: He experiences intermittent discomfort in both knees especially the left knee.  He has good range of motion of both knee joints on examination today with mild discomfort.  No warmth or effusion noted.  Discussed the use of arthritis compression sleeves as well as the use of Voltaren gel which she can apply topically as needed for pain relief.  He will notify us if he notices any joint swelling.  Primary osteoarthritis of both feet: He is not experiencing any increased discomfort in his feet at this time.  DDD (degenerative disc disease), lumbar - X-rays showed multilevel spondylosis and facet joint arthropathy.  He experiences intermittent discomfort in his lower back.  Other medical conditions are listed as follows:  Family history of rheumatoid arthritis-mother  Family history of colon cancer-father  Onychomycosis  Ureteral calculus  History of gastroesophageal reflux (GERD)  Smoker  Orders: Orders Placed This Encounter  Procedures   CBC with Differential/Platelet   COMPLETE METABOLIC PANEL WITH GFR   No orders of the defined types were placed in this encounter.  Follow-Up Instructions: Return in about 3 months (around 08/02/2023) for Rheumatoid arthritis, Osteoarthritis.   Gearldine Bienenstock, PA-C  Note - This record has been created using Dragon software.  Chart creation errors have been sought, but may not always  have been located. Such creation errors do not reflect on  the standard of medical care.

## 2023-05-02 ENCOUNTER — Ambulatory Visit: Payer: 59 | Attending: Physician Assistant | Admitting: Physician Assistant

## 2023-05-02 ENCOUNTER — Encounter: Payer: Self-pay | Admitting: Physician Assistant

## 2023-05-02 VITALS — BP 122/80 | HR 54 | Ht 66.0 in | Wt 232.6 lb

## 2023-05-02 DIAGNOSIS — Z800 Family history of malignant neoplasm of digestive organs: Secondary | ICD-10-CM

## 2023-05-02 DIAGNOSIS — F17200 Nicotine dependence, unspecified, uncomplicated: Secondary | ICD-10-CM

## 2023-05-02 DIAGNOSIS — N201 Calculus of ureter: Secondary | ICD-10-CM

## 2023-05-02 DIAGNOSIS — Z8261 Family history of arthritis: Secondary | ICD-10-CM

## 2023-05-02 DIAGNOSIS — M19041 Primary osteoarthritis, right hand: Secondary | ICD-10-CM | POA: Diagnosis not present

## 2023-05-02 DIAGNOSIS — M5136 Other intervertebral disc degeneration, lumbar region: Secondary | ICD-10-CM | POA: Diagnosis not present

## 2023-05-02 DIAGNOSIS — R768 Other specified abnormal immunological findings in serum: Secondary | ICD-10-CM | POA: Diagnosis not present

## 2023-05-02 DIAGNOSIS — B351 Tinea unguium: Secondary | ICD-10-CM

## 2023-05-02 DIAGNOSIS — M19071 Primary osteoarthritis, right ankle and foot: Secondary | ICD-10-CM | POA: Diagnosis not present

## 2023-05-02 DIAGNOSIS — Z79899 Other long term (current) drug therapy: Secondary | ICD-10-CM | POA: Diagnosis not present

## 2023-05-02 DIAGNOSIS — Z8719 Personal history of other diseases of the digestive system: Secondary | ICD-10-CM

## 2023-05-02 DIAGNOSIS — M0579 Rheumatoid arthritis with rheumatoid factor of multiple sites without organ or systems involvement: Secondary | ICD-10-CM | POA: Diagnosis not present

## 2023-05-02 DIAGNOSIS — G8929 Other chronic pain: Secondary | ICD-10-CM

## 2023-05-02 DIAGNOSIS — M25562 Pain in left knee: Secondary | ICD-10-CM | POA: Diagnosis not present

## 2023-05-02 DIAGNOSIS — M19072 Primary osteoarthritis, left ankle and foot: Secondary | ICD-10-CM

## 2023-05-02 DIAGNOSIS — M19042 Primary osteoarthritis, left hand: Secondary | ICD-10-CM

## 2023-05-02 NOTE — Patient Instructions (Signed)
Voltaren gel 

## 2023-05-03 LAB — COMPLETE METABOLIC PANEL WITH GFR
AG Ratio: 1.4 (calc) (ref 1.0–2.5)
ALT: 20 U/L (ref 9–46)
AST: 17 U/L (ref 10–40)
Albumin: 4.4 g/dL (ref 3.6–5.1)
Alkaline phosphatase (APISO): 111 U/L (ref 36–130)
BUN: 20 mg/dL (ref 7–25)
CO2: 28 mmol/L (ref 20–32)
Calcium: 9.7 mg/dL (ref 8.6–10.3)
Chloride: 102 mmol/L (ref 98–110)
Creat: 0.83 mg/dL (ref 0.60–1.29)
Globulin: 3.1 g/dL (calc) (ref 1.9–3.7)
Glucose, Bld: 92 mg/dL (ref 65–99)
Potassium: 4.1 mmol/L (ref 3.5–5.3)
Sodium: 138 mmol/L (ref 135–146)
Total Bilirubin: 0.5 mg/dL (ref 0.2–1.2)
Total Protein: 7.5 g/dL (ref 6.1–8.1)
eGFR: 109 mL/min/{1.73_m2} (ref >=60)

## 2023-05-03 LAB — CBC WITH DIFFERENTIAL/PLATELET
Absolute Monocytes: 887 {cells}/uL (ref 200–950)
Basophils Absolute: 52 {cells}/uL (ref 0–200)
Basophils Relative: 0.6 %
Eosinophils Absolute: 209 {cells}/uL (ref 15–500)
Eosinophils Relative: 2.4 %
HCT: 47.3 % (ref 38.5–50.0)
Hemoglobin: 16.4 g/dL (ref 13.2–17.1)
Lymphs Abs: 1244 {cells}/uL (ref 850–3900)
MCH: 32.5 pg (ref 27.0–33.0)
MCHC: 34.7 g/dL (ref 32.0–36.0)
MCV: 93.7 fL (ref 80.0–100.0)
MPV: 10 fL (ref 7.5–12.5)
Monocytes Relative: 10.2 %
Neutro Abs: 6308 {cells}/uL (ref 1500–7800)
Neutrophils Relative %: 72.5 %
Platelets: 315 10*3/uL (ref 140–400)
RBC: 5.05 10*6/uL (ref 4.20–5.80)
RDW: 11.8 % (ref 11.0–15.0)
Total Lymphocyte: 14.3 %
WBC: 8.7 10*3/uL (ref 3.8–10.8)

## 2023-05-03 NOTE — Progress Notes (Signed)
CBC and CMP WNL

## 2023-05-10 DIAGNOSIS — R030 Elevated blood-pressure reading, without diagnosis of hypertension: Secondary | ICD-10-CM | POA: Diagnosis not present

## 2023-05-10 DIAGNOSIS — Z0000 Encounter for general adult medical examination without abnormal findings: Secondary | ICD-10-CM | POA: Diagnosis not present

## 2023-05-10 DIAGNOSIS — Z23 Encounter for immunization: Secondary | ICD-10-CM | POA: Diagnosis not present

## 2023-05-10 DIAGNOSIS — K219 Gastro-esophageal reflux disease without esophagitis: Secondary | ICD-10-CM | POA: Diagnosis not present

## 2023-05-10 DIAGNOSIS — Z6837 Body mass index (BMI) 37.0-37.9, adult: Secondary | ICD-10-CM | POA: Diagnosis not present

## 2023-05-10 DIAGNOSIS — E7800 Pure hypercholesterolemia, unspecified: Secondary | ICD-10-CM | POA: Diagnosis not present

## 2023-05-10 DIAGNOSIS — E7801 Familial hypercholesterolemia: Secondary | ICD-10-CM | POA: Diagnosis not present

## 2023-05-10 DIAGNOSIS — R739 Hyperglycemia, unspecified: Secondary | ICD-10-CM | POA: Diagnosis not present

## 2023-05-10 DIAGNOSIS — K449 Diaphragmatic hernia without obstruction or gangrene: Secondary | ICD-10-CM | POA: Diagnosis not present

## 2023-05-21 ENCOUNTER — Encounter (HOSPITAL_COMMUNITY): Payer: Self-pay

## 2023-05-21 ENCOUNTER — Emergency Department (HOSPITAL_COMMUNITY)
Admission: EM | Admit: 2023-05-21 | Discharge: 2023-05-22 | Disposition: A | Discharge status: Home / Self Care | Payer: 59 | Attending: Emergency Medicine | Admitting: Emergency Medicine

## 2023-05-21 ENCOUNTER — Other Ambulatory Visit: Payer: Self-pay

## 2023-05-21 ENCOUNTER — Emergency Department (HOSPITAL_COMMUNITY): Payer: 59

## 2023-05-21 DIAGNOSIS — Z041 Encounter for examination and observation following transport accident: Secondary | ICD-10-CM | POA: Diagnosis not present

## 2023-05-21 DIAGNOSIS — M47816 Spondylosis without myelopathy or radiculopathy, lumbar region: Secondary | ICD-10-CM | POA: Diagnosis not present

## 2023-05-21 DIAGNOSIS — Y92410 Unspecified street and highway as the place of occurrence of the external cause: Secondary | ICD-10-CM | POA: Insufficient documentation

## 2023-05-21 DIAGNOSIS — M549 Dorsalgia, unspecified: Secondary | ICD-10-CM | POA: Diagnosis not present

## 2023-05-21 DIAGNOSIS — S39012A Strain of muscle, fascia and tendon of lower back, initial encounter: Secondary | ICD-10-CM

## 2023-05-21 DIAGNOSIS — M545 Low back pain, unspecified: Secondary | ICD-10-CM | POA: Diagnosis not present

## 2023-05-21 DIAGNOSIS — M5136 Other intervertebral disc degeneration, lumbar region: Secondary | ICD-10-CM | POA: Diagnosis not present

## 2023-05-21 MED ORDER — KETOROLAC TROMETHAMINE 30 MG/ML IJ SOLN
30.0000 mg | Freq: Once | INTRAMUSCULAR | Status: AC
  Administered 2023-05-22: 30 mg via INTRAMUSCULAR
  Filled 2023-05-21: qty 1

## 2023-05-21 MED ORDER — METHOCARBAMOL 500 MG PO TABS
500.0000 mg | ORAL_TABLET | Freq: Once | ORAL | Status: AC
  Administered 2023-05-22: 500 mg via ORAL
  Filled 2023-05-21: qty 1
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
## 2023-05-22 MED ORDER — NAPROXEN 500 MG PO TABS: 500.0000 mg | ORAL_TABLET | Freq: Two times a day (BID) | ORAL | 0 refills | Status: AC

## 2023-05-22 MED ORDER — METHOCARBAMOL 500 MG PO TABS: 500.0000 mg | ORAL_TABLET | Freq: Two times a day (BID) | ORAL | 0 refills | Status: DC

## 2023-05-27 DIAGNOSIS — Z79899 Other long term (current) drug therapy: Secondary | ICD-10-CM | POA: Diagnosis not present

## 2023-05-27 DIAGNOSIS — H2513 Age-related nuclear cataract, bilateral: Secondary | ICD-10-CM | POA: Diagnosis not present

## 2023-05-27 DIAGNOSIS — M05731 Rheumatoid arthritis with rheumatoid factor of right wrist without organ or systems involvement: Secondary | ICD-10-CM | POA: Diagnosis not present

## 2023-07-17 ENCOUNTER — Other Ambulatory Visit: Payer: Self-pay | Admitting: Rheumatology

## 2023-07-17 NOTE — Progress Notes (Unsigned)
Office Visit Note  Patient: Jeffery Mckee             Date of Birth: 1976/03/17           MRN: [id number]             PCP: Dion Saucier, PA-C Referring: Dion Saucier, PA-C Visit Date: 07/31/2023 Occupation: @GUAROCC @  Subjective:  Routine follow-up  History of Present Illness: Jeffery Mckee is a 46 y.o. male with history of seropositive rheumatoid arthritis and osteoarthritis.  Patient was started on Plaquenil twice daily in April 2024.  He initially had significant improvement in his joint inflammation.  He continued to have persistent joint pain to the point he was having to take Aleve on a daily basis for symptomatic relief.  Patient states 3 weeks ago he purchased a new metal ring which has improved his joint pain and inflammation by 90%.  He states that he has decided to discontinue Plaquenil and has taken it only on an as-needed basis if he has increased arthralgias.  Patient states that since wearing the metal ring his symptoms have been significantly better controlled.  He was previously tolerating Plaquenil without any side effects but does not like having to take daily medications chronically due to the concern for possible adverse effects long-term.  He denies any joint swelling at this time.  Activities of Daily Living:  Patient reports morning stiffness for 0 minutes.   Patient Denies nocturnal pain.  Difficulty dressing/grooming: Denies Difficulty climbing stairs: Denies Difficulty getting out of chair: Denies Difficulty using hands for taps, buttons, cutlery, and/or writing: Denies  Review of Systems  Constitutional:  Negative for fatigue.  HENT:  Negative for mouth sores and mouth dryness.   Eyes:  Negative for dryness.  Respiratory:  Negative for shortness of breath.   Cardiovascular:  Negative for chest pain and palpitations.  Gastrointestinal:  Positive for blood in stool, constipation and diarrhea.  Endocrine: Negative for increased urination.   Genitourinary:  Negative for involuntary urination.  Musculoskeletal:  Positive for joint pain and joint pain. Negative for gait problem, joint swelling, myalgias, muscle weakness, morning stiffness, muscle tenderness and myalgias.  Skin:  Negative for color change, rash and sensitivity to sunlight.  Allergic/Immunologic: Negative for susceptible to infections.  Neurological:  Negative for dizziness and headaches.  Hematological:  Negative for swollen glands.  Psychiatric/Behavioral:  Positive for sleep disturbance. Negative for depressed mood. The patient is not nervous/anxious.     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/17/2022   Benign neoplasm of rectosigmoid junction 07/17/2022   Family history of colon cancer 07/17/2022   Ureteral calculus 12/29/2021   Bilateral renal cysts 12/29/2021    Past Medical History:  Diagnosis Date   Arthritis    GERD (gastroesophageal reflux disease)    Nephrolithiasis    Obesity    RA (rheumatoid arthritis) (HCC)     Family History  Problem Relation Age of Onset   Hypertension Mother    Osteoarthritis Mother    Kidney Stones Mother    Diabetes Mother    Gout Mother    Neuropathy Mother    Cancer Father    Hypertension Sister    Diabetes Sister    Past Surgical History:  Procedure Laterality Date   CHOLECYSTECTOMY     COLONOSCOPY WITH PROPOFOL N/A  07/17/2022   Procedure: COLONOSCOPY WITH PROPOFOL;  Surgeon: Lewie Chamber, DO;  Location: [facility];  Service: General;  Laterality: N/A;   HERNIA REPAIR     POLYPECTOMY  07/17/2022   Procedure: POLYPECTOMY INTESTINAL;  Surgeon: Lewie Chamber, DO;  Location: [facility];  Service: General;;   Social History   Social History Narrative   Not on file   Immunization History  Administered Date(s) Administered    Td 05/19/2014     Objective: Vital Signs: BP 120/78 (BP Location: Left Arm, Patient Position: Sitting, Cuff Size: Large)   Pulse 78   Resp 17   Ht 5\' 5"  (1.651 m)   Wt 242 lb 9.6 oz (110 kg)   BMI 40.37 kg/m    Physical Exam Vitals and nursing note reviewed.  Constitutional:      Appearance: He is well-developed.  HENT:     Head: Normocephalic and atraumatic.  Eyes:     Conjunctiva/sclera: Conjunctivae normal.     Pupils: Pupils are equal, round, and reactive to light.  Cardiovascular:     Rate and Rhythm: Normal rate and regular rhythm.     Heart sounds: Normal heart sounds.  Pulmonary:     Effort: Pulmonary effort is normal.     Breath sounds: Normal breath sounds.  Abdominal:     General: Bowel sounds are normal.     Palpations: Abdomen is soft.  Musculoskeletal:     Cervical back: Normal range of motion and neck supple.  Skin:    General: Skin is warm and dry.     Capillary Refill: Capillary refill takes less than 2 seconds.  Neurological:     Mental Status: He is alert and oriented to person, place, and time.  Psychiatric:        Behavior: Behavior normal.      Musculoskeletal Exam: C-spine, thoracic spine, lumbar spine have good range of motion.  Shoulder joints and elbow joints have good range of motion.  Limited flexion and extension of both wrist.  No tenderness or synovitis over MCP joints.  PIP and DIP thickening noted.  Complete fist formation bilaterally.  Hip joints have good range of motion with no groin pain.  Knee joints have good range of motion with no effusion.  Ankle joints have good range of motion with no tenderness or joint swelling.  CDAI Exam: CDAI Score: -- Patient Global: --; Provider Global: -- Swollen: --; Tender: -- Joint Exam 07/31/2023   No joint exam has been documented for this visit   There is currently no information documented on the homunculus. Go to the Rheumatology activity and complete the homunculus joint  exam.  Investigation: No additional findings.  Imaging: No results found.  Recent Labs: Lab Results  Component Value Date   WBC 8.7 05/02/2023   HGB 16.4 05/02/2023   PLT 315 05/02/2023   NA 138 05/02/2023   K 4.1 05/02/2023   CL 102 05/02/2023   CO2 28 05/02/2023   GLUCOSE 92 05/02/2023   BUN 20 05/02/2023   CREATININE 0.83 05/02/2023   BILITOT 0.5 05/02/2023   ALKPHOS 100 12/20/2021   AST 17 05/02/2023   ALT 20 05/02/2023   PROT 7.5 05/02/2023   ALBUMIN 4.2 12/20/2021   CALCIUM 9.7 05/02/2023   GFRAA >60 09/07/2016    Speciality Comments: PLQ Eye Exam: 03/25/2023 WNL @ [facility] Follow up in 1 year   Procedures:  No procedures performed Allergies: Penicillins and Morphine and codeine  Assessment / Plan:     Visit Diagnoses: Rheumatoid arthritis involving multiple sites with positive rheumatoid factor (HCC) - RF 14, anti-CCP 176, ESR 18: He has no synovitis on examination today.  He has prescribed Plaquenil 200 mg 1 tablet by mouth twice daily which was started in April 2024.  He has been tolerating Plaquenil without any side effects.  He works a physically demanding job and has chronic pain involving multiple joints especially his hands, wrist, and knee joints.  He was having to take Aleve on a daily basis for pain relief.  Patient states that 3 weeks ago he purchased a metal ring and since wearing it his joint pain and inflammation has improved by 90%.  He decided to discontinue Plaquenil to see how he would do just wearing the metal ring.  He has had intermittent arthralgias and has taken Plaquenil about once a week for the past 3 weeks. Discussed that I would recommend resuming Plaquenil as prescribed to prevent future flares as well as to slow the progression of rheumatoid arthritis.  He is willing to resume Plaquenil at a reduced dose of once daily.  He was advised to notify us if he develops any new or worsening symptoms.  He will follow-up in the office in  5 months or sooner if needed.  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 Family history of RA-mother.  High risk medication use - Plaquenil 200 mg 1 tablet by mouth daily.  Patient was initiated on Plaquenil 200 mg 1 tablet twice daily in April 2024 but would like to try reducing the dose to once daily since his symptoms have been better controlled.  PLQ Eye Exam: 03/25/2023 WNL @ [facility] Follow up in 1 year.  CBC and CMP updated on 05/02/23-WNL.  Orders for CBC and CMP released today.   He will require updated lab work every 5 months. - Plan: CBC with Differential/Platelet, COMPLETE METABOLIC PANEL WITH GFR  Primary osteoarthritis of both hands: Limited flexion and extension of both wrist.  No synovitis noted today.  He works a physically demanding job which exacerbates his hand pain.  He has noticed a 90% improvement in his hand pain and inflammation since wearing a metal ring that he purchased 3 weeks ago.  He is no longer having to take Aleve on a daily basis for pain relief.  Chronic pain of left knee: No warmth or effusion noted today.  Primary osteoarthritis of both feet: Intermittent discomfort in both feet especially if standing or walking for long distances.  Degeneration of intervertebral disc of lumbar region without discogenic back pain or lower extremity pain: Chronic pain and stiffness.  Other medical conditions are listed as follows:  Family history of rheumatoid arthritis-mother  Family history of colon cancer-father  Onychomycosis  Ureteral calculus  History of gastroesophageal reflux (GERD)  Smoker  Orders: Orders Placed This Encounter  Procedures   CBC with Differential/Platelet   COMPLETE METABOLIC PANEL WITH GFR   No orders of the defined types were placed in this encounter.    Follow-Up Instructions: Return in about 5 months (around 12/29/2023) for Rheumatoid arthritis, Osteoarthritis.   Gearldine Bienenstock, PA-C  Note - This record has been created using  Dragon software.  Chart creation errors have been sought, but may not always  have been located. Such creation errors do not reflect on  the standard of medical care.

## 2023-07-17 NOTE — Telephone Encounter (Signed)
Last Fill: 03/26/2023   Eye exam: 03/25/2023 WNL    Labs: 05/02/2023 CBC and CMP WNL   Next Visit: 07/31/2023  Last Visit: 05/02/2023  DX: Rheumatoid arthritis involving multiple sites with positive rheumatoid factor   Current Dose per office note 05/02/2023: Plaquenil 200mg  by mouth twice daily   Okay to refill Plaquenil?

## 2023-07-31 ENCOUNTER — Ambulatory Visit: Payer: 59 | Attending: Physician Assistant | Admitting: Physician Assistant

## 2023-07-31 ENCOUNTER — Encounter: Payer: Self-pay | Admitting: Physician Assistant

## 2023-07-31 VITALS — BP 120/78 | HR 78 | Resp 17 | Ht 65.0 in | Wt 242.6 lb

## 2023-07-31 DIAGNOSIS — M0579 Rheumatoid arthritis with rheumatoid factor of multiple sites without organ or systems involvement: Secondary | ICD-10-CM

## 2023-07-31 DIAGNOSIS — Z8261 Family history of arthritis: Secondary | ICD-10-CM

## 2023-07-31 DIAGNOSIS — R768 Other specified abnormal immunological findings in serum: Secondary | ICD-10-CM | POA: Diagnosis not present

## 2023-07-31 DIAGNOSIS — F17200 Nicotine dependence, unspecified, uncomplicated: Secondary | ICD-10-CM

## 2023-07-31 DIAGNOSIS — M51369 Other intervertebral disc degeneration, lumbar region without mention of lumbar back pain or lower extremity pain: Secondary | ICD-10-CM | POA: Diagnosis not present

## 2023-07-31 DIAGNOSIS — M19071 Primary osteoarthritis, right ankle and foot: Secondary | ICD-10-CM | POA: Diagnosis not present

## 2023-07-31 DIAGNOSIS — N201 Calculus of ureter: Secondary | ICD-10-CM | POA: Diagnosis not present

## 2023-07-31 DIAGNOSIS — M19042 Primary osteoarthritis, left hand: Secondary | ICD-10-CM

## 2023-07-31 DIAGNOSIS — B351 Tinea unguium: Secondary | ICD-10-CM | POA: Diagnosis not present

## 2023-07-31 DIAGNOSIS — M19041 Primary osteoarthritis, right hand: Secondary | ICD-10-CM

## 2023-07-31 DIAGNOSIS — Z79899 Other long term (current) drug therapy: Secondary | ICD-10-CM | POA: Diagnosis not present

## 2023-07-31 DIAGNOSIS — Z800 Family history of malignant neoplasm of digestive organs: Secondary | ICD-10-CM | POA: Diagnosis not present

## 2023-07-31 DIAGNOSIS — G8929 Other chronic pain: Secondary | ICD-10-CM

## 2023-07-31 DIAGNOSIS — Z8719 Personal history of other diseases of the digestive system: Secondary | ICD-10-CM

## 2023-07-31 DIAGNOSIS — M25562 Pain in left knee: Secondary | ICD-10-CM

## 2023-07-31 DIAGNOSIS — M19072 Primary osteoarthritis, left ankle and foot: Secondary | ICD-10-CM

## 2023-08-01 LAB — CBC WITH DIFFERENTIAL/PLATELET
Absolute Lymphocytes: 1296 {cells}/uL (ref 850–3900)
Absolute Monocytes: 826 {cells}/uL (ref 200–950)
Basophils Absolute: 49 {cells}/uL (ref 0–200)
Basophils Relative: 0.6 %
Eosinophils Absolute: 267 {cells}/uL (ref 15–500)
Eosinophils Relative: 3.3 %
HCT: 46.2 % (ref 38.5–50.0)
Hemoglobin: 16.1 g/dL (ref 13.2–17.1)
MCH: 32.5 pg (ref 27.0–33.0)
MCHC: 34.8 g/dL (ref 32.0–36.0)
MCV: 93.1 fL (ref 80.0–100.0)
MPV: 10.3 fL (ref 7.5–12.5)
Monocytes Relative: 10.2 %
Neutro Abs: 5662 {cells}/uL (ref 1500–7800)
Neutrophils Relative %: 69.9 %
Platelets: 307 10*3/uL (ref 140–400)
RBC: 4.96 10*6/uL (ref 4.20–5.80)
RDW: 11.9 % (ref 11.0–15.0)
Total Lymphocyte: 16 %
WBC: 8.1 10*3/uL (ref 3.8–10.8)

## 2023-08-01 LAB — COMPLETE METABOLIC PANEL WITH GFR
AG Ratio: 1.4 (calc) (ref 1.0–2.5)
ALT: 18 U/L (ref 9–46)
AST: 15 U/L (ref 10–40)
Albumin: 4.3 g/dL (ref 3.6–5.1)
Alkaline phosphatase (APISO): 111 U/L (ref 36–130)
BUN: 17 mg/dL (ref 7–25)
CO2: 28 mmol/L (ref 20–32)
Calcium: 9.6 mg/dL (ref 8.6–10.3)
Chloride: 103 mmol/L (ref 98–110)
Creat: 0.81 mg/dL (ref 0.60–1.29)
Globulin: 3.1 g/dL (ref 1.9–3.7)
Glucose, Bld: 96 mg/dL (ref 65–99)
Potassium: 4 mmol/L (ref 3.5–5.3)
Sodium: 140 mmol/L (ref 135–146)
Total Bilirubin: 0.5 mg/dL (ref 0.2–1.2)
Total Protein: 7.4 g/dL (ref 6.1–8.1)
eGFR: 110 mL/min/{1.73_m2} (ref >=60)

## 2023-08-02 NOTE — Progress Notes (Signed)
CBC and CMP WNL

## 2023-09-04 DIAGNOSIS — C4491 Basal cell carcinoma of skin, unspecified: Secondary | ICD-10-CM

## 2023-09-04 HISTORY — DX: Basal cell carcinoma of skin, unspecified: C44.91

## 2023-11-23 ENCOUNTER — Emergency Department (HOSPITAL_COMMUNITY): Admission: EM | Admit: 2023-11-23 | Discharge: 2023-11-23 | Disposition: A | Discharge status: Home / Self Care

## 2023-11-23 ENCOUNTER — Other Ambulatory Visit: Payer: Self-pay

## 2023-11-23 ENCOUNTER — Encounter (HOSPITAL_COMMUNITY): Payer: Self-pay

## 2023-11-23 DIAGNOSIS — M546 Pain in thoracic spine: Secondary | ICD-10-CM | POA: Diagnosis present

## 2023-11-23 MED ORDER — ACETAMINOPHEN 500 MG PO TABS
1000.0000 mg | ORAL_TABLET | Freq: Once | ORAL | Status: AC
  Administered 2023-11-23: 1000 mg via ORAL
  Filled 2023-11-23: qty 2

## 2023-11-23 MED ORDER — OXYCODONE HCL 5 MG PO TABS
5.0000 mg | ORAL_TABLET | Freq: Four times a day (QID) | ORAL | 0 refills | Status: DC | PRN
Start: 2023-11-23 — End: 2024-01-14

## 2023-11-23 MED ORDER — PREDNISONE 20 MG PO TABS
40.0000 mg | ORAL_TABLET | Freq: Every day | ORAL | 0 refills | Status: AC
Start: 2023-11-23 — End: 2023-11-28

## 2023-11-23 MED ORDER — PREDNISONE 20 MG PO TABS
40.0000 mg | ORAL_TABLET | Freq: Once | ORAL | Status: AC
  Administered 2023-11-23: 40 mg via ORAL
  Filled 2023-11-23: qty 2

## 2023-11-23 MED ORDER — OXYCODONE HCL 5 MG PO TABS
5.0000 mg | ORAL_TABLET | Freq: Once | ORAL | Status: AC
  Administered 2023-11-23: 5 mg via ORAL
  Filled 2023-11-23: qty 1

## 2023-11-23 NOTE — Discharge Instructions (Addendum)
 You are seen in the ER for pain likely related to your rheumatoid arthritis.  You can continue taking the over-the-counter medications for pain but I have given you additional pain medicine which she can use as needed as well as steroids to help with the inflammation.  It is important for you to follow-up closely with your rheumatologist.  Call Monday.  They may want to send a steroid course and then taper it down, that will be up to their discretion.  Please come back to the ER for new or worsening symptoms.  You were prescribed opiate pain medication. This can have many sided effect such as drowsiness, slowed breathing, and conspitation. Do not take it with other medications that cause drowsiness, or drink alcohol while taking it. Take a stool softener over the counter while taking it.  Do not drive or operate machinery while taking this medication.

## 2023-11-23 NOTE — ED Provider Notes (Signed)
 [place] EMERGENCY DEPARTMENT AT [facility] Provider Note   CSN: [id number] Arrival date & time: 11/23/23  1120     History  Chief Complaint  Patient presents with   Back Pain    Jeffery Mckee is a 48 y.o. male. History of RA, he is on Plaquenil.  He presents to the ER complaining of upper back pain for the past couple of days not relieved with OTC medications.  Denies injury or trauma, denies fever or chills, no numbness tingling or weakness.  Feels like prior RA flares.  Patient also notes that he gets significant relief of symptoms with a ring he wears as well as a copper bracelet.  Back Pain      Home Medications Prior to Admission medications   Medication Sig Start Date End Date Taking? Authorizing Provider  acetaminophen (TYLENOL) 650 MG CR tablet Take 650 mg by mouth every 8 (eight) hours as needed for pain.    [provider]  hydroxychloroquine (PLAQUENIL) 200 MG tablet Take 1 tablet by mouth twice daily Patient taking differently: Take 200 mg by mouth once a week. 07/17/23   Gearldine Bienenstock, PA-C  methocarbamol (ROBAXIN) 500 MG tablet Take 1 tablet (500 mg total) by mouth 2 (two) times daily. Patient not taking: Reported on 07/31/2023 05/22/23   Pollyann Savoy, MD  naproxen (NAPROSYN) 500 MG tablet Take 1 tablet (500 mg total) by mouth 2 (two) times daily. Patient not taking: Reported on 07/31/2023 05/22/23   Pollyann Savoy, MD  omeprazole (PRILOSEC) 40 MG capsule Take 40 mg by mouth every other day. 04/11/22   [provider]      Allergies    Penicillins and Morphine and codeine    Review of Systems   Review of Systems  Musculoskeletal:  Positive for back pain.    Physical Exam Updated Vital Signs BP (!) 120/92   Pulse 69   Temp 97.7 F (36.5 C)   Resp 18   Ht 5\' 6"  (1.676 m)   Wt 113.4 kg   SpO2 96%   BMI 40.35 kg/m  Physical Exam Vitals and nursing note reviewed.  Constitutional:      General: He is not  in acute distress.    Appearance: He is well-developed.  HENT:     Head: Normocephalic and atraumatic.  Eyes:     Conjunctiva/sclera: Conjunctivae normal.  Cardiovascular:     Rate and Rhythm: Normal rate and regular rhythm.     Heart sounds: No murmur heard. Pulmonary:     Effort: Pulmonary effort is normal. No respiratory distress.     Breath sounds: Normal breath sounds.  Abdominal:     Palpations: Abdomen is soft.     Tenderness: There is no abdominal tenderness.  Musculoskeletal:        General: No swelling.     Cervical back: Normal range of motion and neck supple.       Back:  Lymphadenopathy:     Cervical: No cervical adenopathy.  Skin:    General: Skin is warm and dry.     Capillary Refill: Capillary refill takes less than 2 seconds.  Neurological:     Mental Status: He is alert.  Psychiatric:        Mood and Affect: Mood normal.     ED Results / Procedures / Treatments   Labs (all labs ordered are listed, but only abnormal results are displayed) Labs Reviewed - No data to display  EKG  None  Radiology No results found.  Procedures Procedures    Medications Ordered in ED Medications - No data to display  ED Course/ Medical Decision Making/ A&P                                 Medical Decision Making DDx: RA flare, muscle strain, contusion, other  Having tenderness of his upper back that feels like prior RA flares, no chest pain or shortness of breath, no pleuritic pain, normal vitals.  Will give patient analgesics and steroids and have him follow-up closely with rheumatology.  He is advised on follow-up and return precautions.  He is agreeable plan of care.  Amount and/or Complexity of Data Reviewed External Data Reviewed: notes.  Risk OTC drugs. Prescription drug management.           Final Clinical Impression(s) / ED Diagnoses Final diagnoses:  None    Rx / DC Orders ED Discharge Orders     None         Josem Kaufmann 11/23/23 1920    Durwin Glaze, MD 11/24/23 [phone]

## 2023-11-23 NOTE — ED Triage Notes (Addendum)
 Pt having flare up of RA in upper part of his back that started Wed. Pt usually takes OTC meds for flare ups but they are not working for this one. Pt did not take his prescribed medications this morning due to plans of coming for treatment

## 2023-12-31 NOTE — Progress Notes (Signed)
 Office Visit Note  Patient: Jeffery Mckee             Date of Birth: 02/21/1976           MRN: [id number]             PCP: Sarrah Cure, PA-C Referring: Sarrah Cure, PA-C Visit Date: 01/14/2024 Occupation: @GUAROCC @  Subjective:  Pain in joints  History of Present Illness: Jeffery Mckee is a 48 y.o. male with rheumatoid arthritis and osteoarthritis.  He returns today for a follow-up visit.  He states he gradually tried to wean off Plaquenil  as he was doing well.  He had a flare of rheumatoid arthritis on November 23, 2023.  He was seen at the emergency room and was given prednisone  taper.  He states that gradually helped.  He also resumed Plaquenil  and has been taking it on a regular basis now.  He complains of discomfort in his right knee joint.  He has been having difficulty getting up from the chair and climbing the stairs.  He also gives history of nocturnal pain.    Activities of Daily Living:  Patient reports morning stiffness for a few minutes intermittently.  Patient Reports nocturnal pain.  Difficulty dressing/grooming: Denies Difficulty climbing stairs: Reports Difficulty getting out of chair: Reports Difficulty using hands for taps, buttons, cutlery, and/or writing: Reports  Review of Systems  Constitutional:  Positive for fatigue.  HENT:  Negative for mouth sores and mouth dryness.   Eyes:  Negative for dryness.  Respiratory:  Negative for difficulty breathing.   Cardiovascular:  Negative for chest pain and palpitations.  Gastrointestinal:  Positive for diarrhea. Negative for blood in stool and constipation.  Endocrine: Negative for increased urination.  Genitourinary:  Negative for involuntary urination.  Musculoskeletal:  Positive for joint pain, joint pain and morning stiffness. Negative for gait problem, joint swelling, myalgias, muscle weakness, muscle tenderness and myalgias.  Skin:  Negative for color change, rash, hair loss, nodules/bumps and  sensitivity to sunlight.  Allergic/Immunologic: Negative for susceptible to infections.  Neurological:  Negative for dizziness and headaches.  Hematological:  Negative for swollen glands.  Psychiatric/Behavioral:  Positive for sleep disturbance. Negative for depressed mood. The patient is not nervous/anxious.     PMFS History:  Patient Active Problem List   Diagnosis Date Noted   Rheumatoid arthritis involving multiple sites with positive rheumatoid factor (HCC) 01/14/2024   Primary osteoarthritis of both hands 01/14/2024   Primary osteoarthritis of both feet 01/14/2024   Degeneration of intervertebral disc of lumbar region without discogenic back pain or lower extremity pain 01/14/2024   Colon cancer screening 07/17/2022   Hemorrhoids without complication 07/17/2022   Benign neoplasm of rectum and anal canal 07/17/2022   Benign neoplasm of rectosigmoid junction 07/17/2022   Family history of colon cancer 07/17/2022   Ureteral calculus 12/29/2021   Bilateral renal cysts 12/29/2021    Past Medical History:  Diagnosis Date   Arthritis    GERD (gastroesophageal reflux disease)    Nephrolithiasis    Obesity    RA (rheumatoid arthritis) (HCC)     Family History  Problem Relation Age of Onset   Hypertension Mother    Osteoarthritis Mother    Kidney Stones Mother    Diabetes Mother    Gout Mother    Neuropathy Mother    Cancer Father    Hypertension Sister    Diabetes Sister    Past Surgical History:  Procedure Laterality Date  CHOLECYSTECTOMY     COLONOSCOPY WITH PROPOFOL  N/A 07/17/2022   Procedure: COLONOSCOPY WITH PROPOFOL ;  Surgeon: Marijo Shove, DO;  Location: [facility];  Service: General;  Laterality: N/A;   HERNIA REPAIR     POLYPECTOMY  07/17/2022   Procedure: POLYPECTOMY INTESTINAL;  Surgeon: Marijo Shove, DO;  Location: [facility];  Service: General;;   Social History   Social History Narrative   Not on file   Immunization  History  Administered Date(s) Administered   Td 05/19/2014     Objective: Vital Signs: BP 114/73 (BP Location: Left Arm, Patient Position: Sitting, Cuff Size: Normal)   Pulse 71   Resp 17   Ht 5\' 5"  (1.651 m)   Wt 247 lb (112 kg)   BMI 41.10 kg/m    Physical Exam Vitals and nursing note reviewed.  Constitutional:      Appearance: He is well-developed.  HENT:     Head: Normocephalic and atraumatic.  Eyes:     Conjunctiva/sclera: Conjunctivae normal.     Pupils: Pupils are equal, round, and reactive to light.  Cardiovascular:     Rate and Rhythm: Normal rate and regular rhythm.     Heart sounds: Normal heart sounds.  Pulmonary:     Effort: Pulmonary effort is normal.     Breath sounds: Normal breath sounds.  Abdominal:     General: Bowel sounds are normal.     Palpations: Abdomen is soft.  Musculoskeletal:     Cervical back: Normal range of motion and neck supple.  Skin:    General: Skin is warm and dry.     Capillary Refill: Capillary refill takes less than 2 seconds.  Neurological:     Mental Status: He is alert and oriented to person, place, and time.  Psychiatric:        Behavior: Behavior normal.      Musculoskeletal Exam: Cervical, 3 thoracic and lumbar spine were in good range of motion.  He had discomfort range of motion of the lumbar spine.  Shoulders and elbows were in good range of motion.  He had limited flexion and extension of the wrist joints.  No synovitis was noted.  He had no tenderness over MCPs PIPs or DIPs.  Bilateral PIP and DIP thickening was noted.  Hip joints with good range of motion.  He had warmth on palpation of his right knee joint.  Left knee joint was in good range of motion.  There was no tenderness over ankles or MTPs.  CDAI Exam: CDAI Score: 12  Patient Global: 40 / 100; Provider Global: 40 / 100 Swollen: 1 ; Tender: 3  Joint Exam 01/14/2024      Right  Left  Wrist   Tender   Tender  Knee  Swollen Tender         Investigation: No additional findings.  Imaging: No results found.  Recent Labs: Lab Results  Component Value Date   WBC 8.1 07/31/2023   HGB 16.1 07/31/2023   PLT 307 07/31/2023   NA 140 07/31/2023   K 4.0 07/31/2023   CL 103 07/31/2023   CO2 28 07/31/2023   GLUCOSE 96 07/31/2023   BUN 17 07/31/2023   CREATININE 0.81 07/31/2023   BILITOT 0.5 07/31/2023   ALKPHOS 100 12/20/2021   AST 15 07/31/2023   ALT 18 07/31/2023   PROT 7.4 07/31/2023   ALBUMIN 4.2 12/20/2021   CALCIUM 9.6 07/31/2023   GFRAA >60 09/07/2016    Speciality Comments:  PLQ Eye Exam: 03/25/2023 WNL @ [facility] Follow up in 1 year   Procedures:  No procedures performed Allergies: Penicillins and Morphine  and codeine   Assessment / Plan:     Visit Diagnoses: Rheumatoid arthritis involving multiple sites with positive rheumatoid factor (HCC) - RF 14, anti-CCP 176, ESR 18: -Patient states that he decided to taper off Plaquenil  as he was doing well.  He reduced Plaquenil  to 1 tablet a day.  He had a severe flare in March for which she was seen at the emergency room.  He states he was given a prednisone  taper.  He noted improvement after taking prednisone  taper.  He also resumed Plaquenil  200 mg p.o. twice daily.  He states he is gradually getting better.  He still continues to have some stiffness in his wrist joints and his right knee joint.  He is also having lower back pain.  Warmth was noted on the palpation of his right knee joint.  Need for taking Plaquenil  regular basis was emphasized.  Plan: Sedimentation rate  Positive anti-CCP test - 176. Family history of RA-mother.  High risk medication use - Plaquenil  200 mg p.o. twice daily.PLQ Eye Exam: 03/25/2023 -he is advised to get annual eye examination.  Plan: CBC with Differential/Platelet, Comprehensive metabolic panel with GFR today.  Primary osteoarthritis of both hands-he had PIP and DIP thickening.  No synovitis was noted.  He had limited  extension and flexion of his wrist joints which is unchanged.  No synovitis was noted.  Chronic pain of right knee-he complains of discomfort in his right knee joint.  Warmth was noted on palpation.  I offered cortisone injection which he declined.  He will contact me if he wants to have cortisone injection.  Primary osteoarthritis of both feet-he denies any discomfort today.  Degeneration of intervertebral disc of lumbar region without discogenic back pain or lower extremity pain-he continues to have discomfort in his lower back.  A handout on back exercises was given.  Dyslipidemia - Plan: Lipid panel  Family history of rheumatoid arthritis-mother  Family history of colon cancer-father  Onychomycosis  Ureteral calculus  History of gastroesophageal reflux (GERD)  Smoker-smoking cessation was discussed.  Association of smoking with rheumatoid arthritis was discussed.    Orders: Orders Placed This Encounter  Procedures   CBC with Differential/Platelet   Comprehensive metabolic panel with GFR   Sedimentation rate   Lipid panel   No orders of the defined types were placed in this encounter.  .  Follow-Up Instructions: Return in about 5 months (around 06/15/2024) for Rheumatoid arthritis, Osteoarthritis.   Nicholas Bari, MD  Note - This record has been created using [employer].  Chart creation errors have been sought, but may not always  have been located. Such creation errors do not reflect on  the standard of medical care.

## 2024-01-14 ENCOUNTER — Ambulatory Visit: Payer: 59 | Attending: Rheumatology | Admitting: Rheumatology

## 2024-01-14 ENCOUNTER — Encounter: Payer: Self-pay | Admitting: Rheumatology

## 2024-01-14 VITALS — BP 114/73 | HR 71 | Resp 17 | Ht 65.0 in | Wt 247.0 lb

## 2024-01-14 DIAGNOSIS — F17200 Nicotine dependence, unspecified, uncomplicated: Secondary | ICD-10-CM

## 2024-01-14 DIAGNOSIS — M51369 Other intervertebral disc degeneration, lumbar region without mention of lumbar back pain or lower extremity pain: Secondary | ICD-10-CM

## 2024-01-14 DIAGNOSIS — Z800 Family history of malignant neoplasm of digestive organs: Secondary | ICD-10-CM

## 2024-01-14 DIAGNOSIS — Z79899 Other long term (current) drug therapy: Secondary | ICD-10-CM | POA: Diagnosis not present

## 2024-01-14 DIAGNOSIS — M0579 Rheumatoid arthritis with rheumatoid factor of multiple sites without organ or systems involvement: Secondary | ICD-10-CM

## 2024-01-14 DIAGNOSIS — E785 Hyperlipidemia, unspecified: Secondary | ICD-10-CM

## 2024-01-14 DIAGNOSIS — Z8719 Personal history of other diseases of the digestive system: Secondary | ICD-10-CM

## 2024-01-14 DIAGNOSIS — R768 Other specified abnormal immunological findings in serum: Secondary | ICD-10-CM | POA: Diagnosis not present

## 2024-01-14 DIAGNOSIS — M19071 Primary osteoarthritis, right ankle and foot: Secondary | ICD-10-CM

## 2024-01-14 DIAGNOSIS — M19042 Primary osteoarthritis, left hand: Secondary | ICD-10-CM

## 2024-01-14 DIAGNOSIS — M19072 Primary osteoarthritis, left ankle and foot: Secondary | ICD-10-CM

## 2024-01-14 DIAGNOSIS — B351 Tinea unguium: Secondary | ICD-10-CM

## 2024-01-14 DIAGNOSIS — M25561 Pain in right knee: Secondary | ICD-10-CM

## 2024-01-14 DIAGNOSIS — M19041 Primary osteoarthritis, right hand: Secondary | ICD-10-CM | POA: Insufficient documentation

## 2024-01-14 DIAGNOSIS — Z8261 Family history of arthritis: Secondary | ICD-10-CM

## 2024-01-14 DIAGNOSIS — G8929 Other chronic pain: Secondary | ICD-10-CM

## 2024-01-14 DIAGNOSIS — N201 Calculus of ureter: Secondary | ICD-10-CM

## 2024-01-14 NOTE — Patient Instructions (Signed)
 Vaccines You are taking a medication(s) that can suppress your immune system.  The following immunizations are recommended: Flu annually Covid-19  Td/Tdap (tetanus, diphtheria, pertussis) every 10 years Pneumonia (Prevnar 15 then Pneumovax 23 at least 1 year apart.  Alternatively, can take Prevnar 20 without needing additional dose) Shingrix: 2 doses from 4 weeks to 6 months apart  Please check with your PCP to make sure you are up to date.   Low Back Sprain or Strain Rehab Ask your health care provider which exercises are safe for you. Do exercises exactly as told by your health care provider and adjust them as directed. It is normal to feel mild stretching, pulling, tightness, or discomfort as you do these exercises. Stop right away if you feel sudden pain or your pain gets worse. Do not begin these exercises until told by your health care provider. Stretching and range-of-motion exercises These exercises warm up your muscles and joints and improve the movement and flexibility of your back. These exercises also help to relieve pain, numbness, and tingling. Lumbar rotation  Lie on your back on a firm bed or the floor with your knees bent. Straighten your arms out to your sides so each arm forms a 90-degree angle (right angle) with a side of your body. Slowly move (rotate) both of your knees to one side of your body until you feel a stretch in your lower back (lumbar). Try not to let your shoulders lift off the floor. Hold this position for __________ seconds. Tense your abdominal muscles and slowly move your knees back to the starting position. Repeat this exercise on the other side of your body. Repeat __________ times. Complete this exercise __________ times a day. Single knee to chest  Lie on your back on a firm bed or the floor with both legs straight. Bend one of your knees. Use your hands to move your knee up toward your chest until you feel a gentle stretch in your lower back and  buttock. Hold your leg in this position by holding on to the front of your knee. Keep your other leg as straight as possible. Hold this position for __________ seconds. Slowly return to the starting position. Repeat with your other leg. Repeat __________ times. Complete this exercise __________ times a day. Prone extension on elbows  Lie on your abdomen on a firm bed or the floor (prone position). Prop yourself up on your elbows. Use your arms to help lift your chest up until you feel a gentle stretch in your abdomen and your lower back. This will place some of your body weight on your elbows. If this is uncomfortable, try stacking pillows under your chest. Your hips should stay down, against the surface that you are lying on. Keep your hip and back muscles relaxed. Hold this position for __________ seconds. Slowly relax your upper body and return to the starting position. Repeat __________ times. Complete this exercise __________ times a day. Strengthening exercises These exercises build strength and endurance in your back. Endurance is the ability to use your muscles for a long time, even after they get tired. Pelvic tilt This exercise strengthens the muscles that lie deep in the abdomen. Lie on your back on a firm bed or the floor with your legs extended. Bend your knees so they are pointing toward the ceiling and your feet are flat on the floor. Tighten your lower abdominal muscles to press your lower back against the floor. This motion will tilt your pelvis so  your tailbone points up toward the ceiling instead of pointing to your feet or the floor. To help with this exercise, you may place a small towel under your lower back and try to push your back into the towel. Hold this position for __________ seconds. Let your muscles relax completely before you repeat this exercise. Repeat __________ times. Complete this exercise __________ times a day. Alternating arm and leg raises  Get  on your hands and knees on a firm surface. If you are on a hard floor, you may want to use padding, such as an exercise mat, to cushion your knees. Line up your arms and legs. Your hands should be directly below your shoulders, and your knees should be directly below your hips. Lift your left leg behind you. At the same time, raise your right arm and straighten it in front of you. Do not lift your leg higher than your hip. Do not lift your arm higher than your shoulder. Keep your abdominal and back muscles tight. Keep your hips facing the ground. Do not arch your back. Keep your balance carefully, and do not hold your breath. Hold this position for __________ seconds. Slowly return to the starting position. Repeat with your right leg and your left arm. Repeat __________ times. Complete this exercise __________ times a day. Abdominal set with straight leg raise  Lie on your back on a firm bed or the floor. Bend one of your knees and keep your other leg straight. Tense your abdominal muscles and lift your straight leg up, 4-6 inches (10-15 cm) off the ground. Keep your abdominal muscles tight and hold this position for __________ seconds. Do not hold your breath. Do not arch your back. Keep it flat against the ground. Keep your abdominal muscles tense as you slowly lower your leg back to the starting position. Repeat with your other leg. Repeat __________ times. Complete this exercise __________ times a day. Single leg lower with bent knees Lie on your back on a firm bed or the floor. Tense your abdominal muscles and lift your feet off the floor, one foot at a time, so your knees and hips are bent in 90-degree angles (right angles). Your knees should be over your hips and your lower legs should be parallel to the floor. Keeping your abdominal muscles tense and your knee bent, slowly lower one of your legs so your toe touches the ground. Lift your leg back up to return to the starting  position. Do not hold your breath. Do not let your back arch. Keep your back flat against the ground. Repeat with your other leg. Repeat __________ times. Complete this exercise __________ times a day. Posture and body mechanics Good posture and healthy body mechanics can help to relieve stress in your body's tissues and joints. Body mechanics refers to the movements and positions of your body while you do your daily activities. Posture is part of body mechanics. Good posture means: Your spine is in its natural S-curve position (neutral). Your shoulders are pulled back slightly. Your head is not tipped forward (neutral). Follow these guidelines to improve your posture and body mechanics in your everyday activities. Standing  When standing, keep your spine neutral and your feet about hip-width apart. Keep a slight bend in your knees. Your ears, shoulders, and hips should line up. When you do a task in which you stand in one place for a long time, place one foot up on a stable object that is 2-4 inches (5-10  cm) high, such as a footstool. This helps keep your spine neutral. Sitting  When sitting, keep your spine neutral and keep your feet flat on the floor. Use a footrest, if necessary, and keep your thighs parallel to the floor. Avoid rounding your shoulders, and avoid tilting your head forward. When working at a desk or a computer, keep your desk at a height where your hands are slightly lower than your elbows. Slide your chair under your desk so you are close enough to maintain good posture. When working at a computer, place your monitor at a height where you are looking straight ahead and you do not have to tilt your head forward or downward to look at the screen. Resting When lying down and resting, avoid positions that are most painful for you. If you have pain with activities such as sitting, bending, stooping, or squatting, lie in a position in which your body does not bend very much. For  example, avoid curling up on your side with your arms and knees near your chest (fetal position). If you have pain with activities such as standing for a long time or reaching with your arms, lie with your spine in a neutral position and bend your knees slightly. Try the following positions: Lying on your side with a pillow between your knees. Lying on your back with a pillow under your knees. Lifting  When lifting objects, keep your feet at least shoulder-width apart and tighten your abdominal muscles. Bend your knees and hips and keep your spine neutral. It is important to lift using the strength of your legs, not your back. Do not lock your knees straight out. Always ask for help to lift heavy or awkward objects. This information is not intended to replace advice given to you by your health care provider. Make sure you discuss any questions you have with your health care provider. Document Revised: 12/24/2022 Document Reviewed: 11/07/2020 Elsevier Patient Education  2024 [employer].

## 2024-01-15 ENCOUNTER — Ambulatory Visit: Payer: Self-pay | Admitting: Rheumatology

## 2024-01-15 LAB — CBC WITH DIFFERENTIAL/PLATELET
Absolute Lymphocytes: 1377 {cells}/uL (ref 850–3900)
Absolute Monocytes: 927 {cells}/uL (ref 200–950)
Basophils Absolute: 43 {cells}/uL (ref 0–200)
Basophils Relative: 0.5 %
Eosinophils Absolute: 298 {cells}/uL (ref 15–500)
Eosinophils Relative: 3.5 %
HCT: 45.4 % (ref 38.5–50.0)
Hemoglobin: 15.8 g/dL (ref 13.2–17.1)
MCH: 32.4 pg (ref 27.0–33.0)
MCHC: 34.8 g/dL (ref 32.0–36.0)
MCV: 93.2 fL (ref 80.0–100.0)
MPV: 10 fL (ref 7.5–12.5)
Monocytes Relative: 10.9 %
Neutro Abs: 5857 {cells}/uL (ref 1500–7800)
Neutrophils Relative %: 68.9 %
Platelets: 326 10*3/uL (ref 140–400)
RBC: 4.87 10*6/uL (ref 4.20–5.80)
RDW: 12.1 % (ref 11.0–15.0)
Total Lymphocyte: 16.2 %
WBC: 8.5 10*3/uL (ref 3.8–10.8)

## 2024-01-15 LAB — COMPREHENSIVE METABOLIC PANEL WITH GFR
AG Ratio: 1.6 (calc) (ref 1.0–2.5)
ALT: 19 U/L (ref 9–46)
AST: 16 U/L (ref 10–40)
Albumin: 4.3 g/dL (ref 3.6–5.1)
Alkaline phosphatase (APISO): 104 U/L (ref 36–130)
BUN: 19 mg/dL (ref 7–25)
CO2: 31 mmol/L (ref 20–32)
Calcium: 9.3 mg/dL (ref 8.6–10.3)
Chloride: 103 mmol/L (ref 98–110)
Creat: 0.75 mg/dL (ref 0.60–1.29)
Globulin: 2.7 g/dL (ref 1.9–3.7)
Glucose, Bld: 99 mg/dL (ref 65–99)
Potassium: 4.2 mmol/L (ref 3.5–5.3)
Sodium: 140 mmol/L (ref 135–146)
Total Bilirubin: 0.5 mg/dL (ref 0.2–1.2)
Total Protein: 7 g/dL (ref 6.1–8.1)
eGFR: 112 mL/min/{1.73_m2} (ref >=60)

## 2024-01-15 LAB — LIPID PANEL
Cholesterol: 145 mg/dL (ref <200)
HDL: 37 mg/dL — ABNORMAL LOW (ref >=40)
LDL Cholesterol (Calc): 94 mg/dL
Non-HDL Cholesterol (Calc): 108 mg/dL (ref <130)
Total CHOL/HDL Ratio: 3.9 (calc) (ref <5.0)
Triglycerides: 58 mg/dL (ref <150)

## 2024-01-15 LAB — SEDIMENTATION RATE: Sed Rate: 6 mm/h (ref 0–15)

## 2024-01-15 NOTE — Progress Notes (Signed)
 HDL is low at 37 which may improve with the exercise.  CBC normal, CMP normal.  Sed rate normal.  Please forward results to his PCP.

## 2024-03-18 ENCOUNTER — Other Ambulatory Visit: Payer: Self-pay | Admitting: *Deleted

## 2024-03-18 MED ORDER — HYDROXYCHLOROQUINE SULFATE 200 MG PO TABS: 200.0000 mg | ORAL_TABLET | Freq: Two times a day (BID) | ORAL | 2 refills | Status: DC

## 2024-03-18 NOTE — Telephone Encounter (Signed)
 Patient contacted the office and requested a refill on PLQ.  Last Fill: 07/17/2023  Eye exam: 03/25/2023 WNL    Labs: 01/14/2024 HDL is low at 37 which may improve with the exercise.  CBC normal, CMP normal.  Sed rate normal.   Next Visit: 06/15/2024  Last Visit: 01/14/2024  IK:Myzlfjunpi arthritis involving multiple sites with positive rheumatoid factor   Current Dose per office note 01/14/2024: Plaquenil  200 mg p.o. twice daily.   Patient states he has his eye exam scheduled to be updated later this month.   Okay to refill Plaquenil ?

## 2024-04-09 ENCOUNTER — Telehealth: Payer: Self-pay | Admitting: *Deleted

## 2024-04-09 DIAGNOSIS — M0579 Rheumatoid arthritis with rheumatoid factor of multiple sites without organ or systems involvement: Secondary | ICD-10-CM

## 2024-04-09 DIAGNOSIS — Z1159 Encounter for screening for other viral diseases: Secondary | ICD-10-CM

## 2024-04-09 DIAGNOSIS — Z111 Encounter for screening for respiratory tuberculosis: Secondary | ICD-10-CM

## 2024-04-09 DIAGNOSIS — R768 Other specified abnormal immunological findings in serum: Secondary | ICD-10-CM

## 2024-04-09 DIAGNOSIS — Z79899 Other long term (current) drug therapy: Secondary | ICD-10-CM

## 2024-04-09 DIAGNOSIS — Z9225 Personal history of immunosupression therapy: Secondary | ICD-10-CM

## 2024-04-09 NOTE — Progress Notes (Signed)
 Office Visit Note  Patient: Jeffery Mckee             Date of Birth: [birth year]           MRN: [id number]             PCP: Vida Mardy DEL, PA-C Referring: Vida Mardy DEL, PA-C Visit Date: 04/15/2024 Occupation: @GUAROCC @  Subjective:  Discuss methotrexate    History of Present Illness: Jeffery Mckee is a 48 y.o. male with history of seropositive rheumatoid arthritis and osteoarthritis.  Patient is taking plaquenil  200 mg 1 tablet by mouth twice daily.  He is tolerating Plaquenil  without any side effects and has not had any recent gaps in therapy.  Patient presents today to discuss adding on methotrexate  as combination therapy.  Patient states for the past 2 weeks he has been experiencing significant discomfort in the right knee.  Patient states he has been wearing a compressive sleeve on the knee for support but states he would like a knee brace for more stability.  Patient states he has been having to take Tylenol  1000 mg daily for symptomatic relief.  His symptoms have been inadequately controlled on the current treatment regimen.  He continues to have chronic pain in both hands.   Activities of Daily Living:  Patient reports morning stiffness for 1 hour.   Patient Reports nocturnal pain.  Difficulty dressing/grooming: Reports Difficulty climbing stairs: Reports Difficulty getting out of chair: Reports Difficulty using hands for taps, buttons, cutlery, and/or writing: Denies  Review of Systems  Constitutional:  Positive for fatigue.  HENT:  Negative for mouth sores and mouth dryness.   Eyes:  Negative for dryness.  Respiratory:  Negative for shortness of breath.   Cardiovascular:  Negative for chest pain and palpitations.  Gastrointestinal:  Positive for blood in stool and diarrhea. Negative for constipation.  Endocrine: Negative for increased urination.  Genitourinary:  Negative for involuntary urination.  Musculoskeletal:  Positive for joint pain, joint pain, joint  swelling, myalgias, muscle weakness, morning stiffness, muscle tenderness and myalgias. Negative for gait problem.  Skin:  Positive for sensitivity to sunlight. Negative for color change, rash and hair loss.  Allergic/Immunologic: Negative for susceptible to infections.  Neurological:  Negative for dizziness and headaches.  Hematological:  Negative for swollen glands.  Psychiatric/Behavioral:  Positive for sleep disturbance. Negative for depressed mood. The patient is not nervous/anxious.     PMFS History:  Patient Active Problem List   Diagnosis Date Noted   Rheumatoid arthritis involving multiple sites with positive rheumatoid factor (HCC) 01/14/2024   Primary osteoarthritis of both hands 01/14/2024   Primary osteoarthritis of both feet 01/14/2024   Degeneration of intervertebral disc of lumbar region without discogenic back pain or lower extremity pain 01/14/2024   Colon cancer screening 07/17/2022   Hemorrhoids without complication 07/17/2022   Benign neoplasm of rectum and anal canal 07/17/2022   Benign neoplasm of rectosigmoid junction 07/17/2022   Family history of colon cancer 07/17/2022   Ureteral calculus 12/29/2021   Bilateral renal cysts 12/29/2021    Past Medical History:  Diagnosis Date   Arthritis    GERD (gastroesophageal reflux disease)    Nephrolithiasis    Obesity    RA (rheumatoid arthritis) (HCC)     Family History  Problem Relation Age of Onset   Hypertension Mother    Osteoarthritis Mother    Kidney Stones Mother    Diabetes Mother    Gout Mother    Neuropathy Mother  Cancer Father    Hypertension Sister    Diabetes Sister    Past Surgical History:  Procedure Laterality Date   CHOLECYSTECTOMY     COLONOSCOPY WITH PROPOFOL  N/A 07/17/2022   Procedure: COLONOSCOPY WITH PROPOFOL ;  Surgeon: Evonnie Dorothyann LABOR, DO;  Location: [facility];  Service: General;  Laterality: N/A;   HERNIA REPAIR     POLYPECTOMY  07/17/2022   Procedure:  POLYPECTOMY INTESTINAL;  Surgeon: Evonnie Dorothyann LABOR, DO;  Location: [facility];  Service: General;;   Social History   Social History Narrative   Not on file   Immunization History  Administered Date(s) Administered   Td 05/19/2014     Objective: Vital Signs: BP 115/74 (BP Location: Left Wrist, Patient Position: Sitting, Cuff Size: Normal)   Pulse (!) 58   Resp 16   Ht 5' 5 (1.651 m)   Wt 237 lb (107.5 kg)   BMI 39.44 kg/m    Physical Exam Vitals and nursing note reviewed.  Constitutional:      Appearance: He is well-developed.  HENT:     Head: Normocephalic and atraumatic.  Eyes:     Conjunctiva/sclera: Conjunctivae normal.     Pupils: Pupils are equal, round, and reactive to light.  Cardiovascular:     Rate and Rhythm: Normal rate and regular rhythm.     Heart sounds: Normal heart sounds.  Pulmonary:     Effort: Pulmonary effort is normal.     Breath sounds: Normal breath sounds.  Abdominal:     General: Bowel sounds are normal.     Palpations: Abdomen is soft.  Musculoskeletal:     Cervical back: Normal range of motion and neck supple.  Skin:    General: Skin is warm and dry.     Capillary Refill: Capillary refill takes less than 2 seconds.  Neurological:     Mental Status: He is alert and oriented to person, place, and time.  Psychiatric:        Behavior: Behavior normal.      Musculoskeletal Exam: C-spine, thoracic spine, lumbar spine and good range of motion.  Shoulder joints have good range of motion.  Limited flexion and extension of both wrist joints.  Inflammation noted in the right wrist.  PIP and DIP thickening consistent with osteoarthritis of both hands.  Hip joints have good range of motion with no groin pain.  Warmth and swelling of the right knee.  Left knee has good range of motion with no warmth or effusion.  Ankle joints have good range of motion with some tenderness but no effusion noted.  CDAI Exam: CDAI Score: -- Patient Global:  --; Provider Global: -- Swollen: 2 ; Tender: 3  Joint Exam 04/15/2024      Right  Left  Wrist  Swollen Tender   Tender  Knee  Swollen Tender        Investigation: No additional findings.  Imaging: No results found.  Recent Labs: Lab Results  Component Value Date   WBC 9.7 04/10/2024   HGB 15.8 04/10/2024   PLT 336 04/10/2024   NA 140 04/10/2024   K 3.8 04/10/2024   CL 104 04/10/2024   CO2 27 04/10/2024   GLUCOSE 83 04/10/2024   BUN 16 04/10/2024   CREATININE 0.70 04/10/2024   BILITOT 0.3 04/10/2024   ALKPHOS 100 12/20/2021   AST 15 04/10/2024   ALT 15 04/10/2024   PROT 7.3 04/10/2024   PROT 7.3 04/10/2024   ALBUMIN 4.2 12/20/2021  CALCIUM 9.3 04/10/2024   GFRAA >60 09/07/2016   QFTBGOLDPLUS NEGATIVE 04/10/2024    Speciality Comments: PLQ Eye Exam: 03/25/2023 WNL @ [facility] Follow up in 1 year   Procedures:  No procedures performed Allergies: Penicillins and Morphine  and codeine       Assessment / Plan:     Visit Diagnoses: Rheumatoid arthritis involving multiple sites with positive rheumatoid factor (HCC) - RF 14, anti-CCP 176, ESR 18: Patient presents today with ongoing pain and stiffness involving multiple joints.  He has been experiencing a flare in the right knee for the past 2 weeks.  He also has ongoing pain in both wrist joints and both hands.  He has remained on Plaquenil  200 mg 1 tablet by mouth twice daily.  He is tolerating Plaquenil  without any side effects and has not had any recent gaps in therapy.  He has been taking Tylenol  1000 mg daily for symptomatic relief.  He has also been using a compression sleeve on the right knee for support.  He presents today to discuss treatment alternatives.  Indications, contraindications, potential side effects of methotrexate  were reviewed today in detail.  All questions were addressed and consent was obtained.  Plan to send in methotrexate  6 tablets by mouth once weekly and if labs are stable he will  increase to 8 tablets weekly after 2 weeks.  He will take folic acid  2 mg daily.  He was advised to notify us  if he cannot tolerate taking methotrexate .  He will follow-up in the office in 6 to 8 weeks to assess his response.  To alleviate his current symptoms a prednisone  taper starting at 20 mg tapering by 5 mg every 4 days were sent to the pharmacy.  He was also given a prescription for a right knee brace for support.- Plan: DG Chest 2 View  Drug Counseling TB Gold: negative 04/10/24 Hepatitis panel: negative 04/10/24  Chest-xray:  negative 06/16/16  Contraception: Wife-tubal ligation.   Alcohol use: Patient does not drink alcohol.   Patient was counseled on the purpose, proper use, and adverse effects of methotrexate  including nausea, infection, and signs and symptoms of pneumonitis.  Reviewed instructions with patient to take methotrexate  weekly along with folic acid  daily.  Discussed the importance of frequent monitoring of kidney and liver function and blood counts, and provided patient with standing lab instructions.  Counseled patient to avoid NSAIDs and alcohol while on methotrexate .  Provided patient with educational materials on methotrexate  and answered all questions.  Advised patient to get annual influenza vaccine and to get a pneumococcal vaccine if patient has not already had one.  Patient voiced understanding.  Patient consented to methotrexate  use.  Will upload into chart.    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 Family history of RA-mother.  High risk medication use - Plaquenil  200 mg 1 tablet by mouth twice daily.  Plan to add on methotrexate  6 tablets by mouth once weekly x 2 weeks and if labs are stable he will increase to 8 tablets weekly.  He will also take folic acid  2 mg daily. PLQ Eye Exam: 03/25/2023 WNL @ [facility] Follow up in 1 year.  He is scheduled for an eye examination this month. CBC and CMP updated on 04/10/24.  He will require updated lab work in 2 weeks x 2, 2  months, then every 3 months. TB gold negative on 04/10/24.   Plan: DG Chest 2 View  Primary osteoarthritis of both hands: PIP and DIP thickening.  Limited ROM of both wrist joints with tenderness bilaterally.  Inflammation in the right wrist noted.  A prednisone  taper was sent to the pharmacy today.   Chronic pain of right knee: He presents today with increased pain in the right knee. He has been wearing a compression knee sleeve for support.   Primary osteoarthritis of both feet: He has tenderness of both ankle joints.  He is wearing proper fitting shoes.  Degeneration of intervertebral disc of lumbar region without discogenic back pain or lower extremity pain: Intermittent discomfort.   Other medical conditions are listed as follows:   Family history of rheumatoid arthritis-mother  Family history of colon cancer-father  Onychomycosis  Ureteral calculus  History of gastroesophageal reflux (GERD)  Smoker  Dyslipidemia: Lipid panel obtained on 01/14/2024: HDL low at 37, rest of panel within normal limits.  Orders: Orders Placed This Encounter  Procedures   DG Chest 2 View   Meds ordered this encounter  Medications   predniSONE  (DELTASONE ) 5 MG tablet    Sig: Take 4 tabs po x 4 days, 3  tabs po x 4 days, 2  tabs po x 4 days, 1  tab po x 4 days    Dispense:  40 tablet    Refill:  0     Follow-Up Instructions: Return in about 8 weeks (around 06/10/2024) for Rheumatoid arthritis, Osteoarthritis.   Waddell Jeffery Craze, PA-C  Note - This record has been created using Dragon software.  Chart creation errors have been sought, but may not always  have been located. Such creation errors do not reflect on  the standard of medical care.

## 2024-04-09 NOTE — Telephone Encounter (Signed)
 Patient contacted the office stating he is having increased pain and swelling in his right knee. Patient states the pain has been going on for about 1 week. Patient states it was so bad last week that he was using a cane to assist him with walking. Patient states he has not had any injury. Patient states he has been taking PLQ as prescribed. Patient states he has tried his knee brace, OTC creams, OTC patches, Tylenol , Ibuprofen , Epsom Salt baths and soaking in the hot tub. He states that epsom salt baths and soaking in the hot tub are the only thing that have given him any relief. Patient denies any redness or warmth. Please advise.

## 2024-04-09 NOTE — Addendum Note (Signed)
 Addended by: CENA ALFONSO CROME on: 04/09/2024 04:49 PM   Modules accepted: Orders

## 2024-04-09 NOTE — Telephone Encounter (Signed)
 Patient will need more aggressive therapy.  Plaquenil  is not adequate.  Please have him come in for labs including CBC with differential, CMP with GFR, hepatitis panel, TB Gold, SPEP, immunoglobulins.  Please schedule an earlier appointment to start on methotrexate.

## 2024-04-09 NOTE — Telephone Encounter (Signed)
 Patient advised he will need more aggressive therapy.  Plaquenil  is not adequate.  Patient will go to [facility] in [place] for labs including CBC with differential, CMP with GFR, hepatitis panel, TB Gold, SPEP, immunoglobulins. Orders placed and released. Patient scheduled an appointment to start on methotrexate on 04/15/2024.

## 2024-04-15 ENCOUNTER — Encounter: Payer: Self-pay | Admitting: Physician Assistant

## 2024-04-15 ENCOUNTER — Other Ambulatory Visit: Payer: Self-pay

## 2024-04-15 ENCOUNTER — Ambulatory Visit: Attending: Physician Assistant | Admitting: Physician Assistant

## 2024-04-15 VITALS — BP 115/74 | HR 58 | Resp 16 | Ht 65.0 in | Wt 237.0 lb

## 2024-04-15 DIAGNOSIS — Z79899 Other long term (current) drug therapy: Secondary | ICD-10-CM | POA: Diagnosis not present

## 2024-04-15 DIAGNOSIS — Z8261 Family history of arthritis: Secondary | ICD-10-CM

## 2024-04-15 DIAGNOSIS — M19042 Primary osteoarthritis, left hand: Secondary | ICD-10-CM

## 2024-04-15 DIAGNOSIS — M19072 Primary osteoarthritis, left ankle and foot: Secondary | ICD-10-CM

## 2024-04-15 DIAGNOSIS — M51369 Other intervertebral disc degeneration, lumbar region without mention of lumbar back pain or lower extremity pain: Secondary | ICD-10-CM

## 2024-04-15 DIAGNOSIS — B351 Tinea unguium: Secondary | ICD-10-CM

## 2024-04-15 DIAGNOSIS — M25561 Pain in right knee: Secondary | ICD-10-CM

## 2024-04-15 DIAGNOSIS — N201 Calculus of ureter: Secondary | ICD-10-CM

## 2024-04-15 DIAGNOSIS — G8929 Other chronic pain: Secondary | ICD-10-CM

## 2024-04-15 DIAGNOSIS — M19071 Primary osteoarthritis, right ankle and foot: Secondary | ICD-10-CM

## 2024-04-15 DIAGNOSIS — Z8719 Personal history of other diseases of the digestive system: Secondary | ICD-10-CM

## 2024-04-15 DIAGNOSIS — M19041 Primary osteoarthritis, right hand: Secondary | ICD-10-CM

## 2024-04-15 DIAGNOSIS — F17200 Nicotine dependence, unspecified, uncomplicated: Secondary | ICD-10-CM

## 2024-04-15 DIAGNOSIS — M0579 Rheumatoid arthritis with rheumatoid factor of multiple sites without organ or systems involvement: Secondary | ICD-10-CM | POA: Diagnosis not present

## 2024-04-15 DIAGNOSIS — Z800 Family history of malignant neoplasm of digestive organs: Secondary | ICD-10-CM

## 2024-04-15 DIAGNOSIS — E785 Hyperlipidemia, unspecified: Secondary | ICD-10-CM

## 2024-04-15 DIAGNOSIS — R768 Other specified abnormal immunological findings in serum: Secondary | ICD-10-CM

## 2024-04-15 LAB — HEPATITIS C ANTIBODY: Hepatitis C Ab: NONREACTIVE

## 2024-04-15 LAB — HEPATITIS B CORE ANTIBODY, IGM: Hep B C IgM: NONREACTIVE

## 2024-04-15 LAB — CBC WITH DIFFERENTIAL/PLATELET
Absolute Lymphocytes: 1795 {cells}/uL (ref 850–3900)
Absolute Monocytes: 902 {cells}/uL (ref 200–950)
Basophils Absolute: 39 {cells}/uL (ref 0–200)
Basophils Relative: 0.4 %
Eosinophils Absolute: 301 {cells}/uL (ref 15–500)
Eosinophils Relative: 3.1 %
HCT: 46.5 % (ref 38.5–50.0)
Hemoglobin: 15.8 g/dL (ref 13.2–17.1)
MCH: 32 pg (ref 27.0–33.0)
MCHC: 34 g/dL (ref 32.0–36.0)
MCV: 94.3 fL (ref 80.0–100.0)
MPV: 9.8 fL (ref 7.5–12.5)
Monocytes Relative: 9.3 %
Neutro Abs: 6664 {cells}/uL (ref 1500–7800)
Neutrophils Relative %: 68.7 %
Platelets: 336 Thousand/uL (ref 140–400)
RBC: 4.93 Million/uL (ref 4.20–5.80)
RDW: 12 % (ref 11.0–15.0)
Total Lymphocyte: 18.5 %
WBC: 9.7 Thousand/uL (ref 3.8–10.8)

## 2024-04-15 LAB — HEPATITIS A ANTIBODY, IGM: Hep A IgM: NONREACTIVE

## 2024-04-15 LAB — PROTEIN ELECTROPHORESIS, SERUM, WITH REFLEX
Albumin ELP: 4.1 g/dL (ref 3.8–4.8)
Alpha 1: 0.3 g/dL (ref 0.2–0.3)
Alpha 2: 0.8 g/dL (ref 0.5–0.9)
Beta 2: 0.4 g/dL (ref 0.2–0.5)
Beta Globulin: 0.4 g/dL (ref 0.4–0.6)
Gamma Globulin: 1.2 g/dL (ref 0.8–1.7)
Total Protein: 7.3 g/dL (ref 6.1–8.1)

## 2024-04-15 LAB — COMPREHENSIVE METABOLIC PANEL WITH GFR
AG Ratio: 1.4 (calc) (ref 1.0–2.5)
ALT: 15 U/L (ref 9–46)
AST: 15 U/L (ref 10–40)
Albumin: 4.2 g/dL (ref 3.6–5.1)
Alkaline phosphatase (APISO): 111 U/L (ref 36–130)
BUN: 16 mg/dL (ref 7–25)
CO2: 27 mmol/L (ref 20–32)
Calcium: 9.3 mg/dL (ref 8.6–10.3)
Chloride: 104 mmol/L (ref 98–110)
Creat: 0.7 mg/dL (ref 0.60–1.29)
Globulin: 3.1 g/dL (ref 1.9–3.7)
Glucose, Bld: 83 mg/dL (ref 65–99)
Potassium: 3.8 mmol/L (ref 3.5–5.3)
Sodium: 140 mmol/L (ref 135–146)
Total Bilirubin: 0.3 mg/dL (ref 0.2–1.2)
Total Protein: 7.3 g/dL (ref 6.1–8.1)
eGFR: 114 mL/min/1.73m2 (ref >=60)

## 2024-04-15 LAB — QUANTIFERON-TB GOLD PLUS
Mitogen-NIL: 4.54 [IU]/mL
NIL: 0.19 [IU]/mL
QuantiFERON-TB Gold Plus: NEGATIVE
TB1-NIL: <0 [IU]/mL
TB2-NIL: <0 [IU]/mL

## 2024-04-15 LAB — IGG, IGA, IGM
IgG (Immunoglobin G), Serum: 1415 mg/dL (ref 600–1640)
IgM, Serum: 51 mg/dL (ref 50–300)
Immunoglobulin A: 243 mg/dL (ref 47–310)

## 2024-04-15 MED ORDER — FOLIC ACID 1 MG PO TABS
2.0000 mg | ORAL_TABLET | Freq: Every day | ORAL | 2 refills | Status: AC
Start: 2024-04-15 — End: ?

## 2024-04-15 MED ORDER — PREDNISONE 5 MG PO TABS: ORAL_TABLET | ORAL | 0 refills | Status: DC

## 2024-04-15 MED ORDER — METHOTREXATE SODIUM 2.5 MG PO TABS
ORAL_TABLET | ORAL | 0 refills | Status: AC
Start: 2024-04-15 — End: ?

## 2024-04-15 NOTE — Progress Notes (Unsigned)
 Per Cheryl Waddell HERO, PA-C, will place standing lab orders for CBC and CMP for the patient. After reviewing the chart, patient goes to [facility] in [place] to get labs done. Will place the standing orders for [facility].

## 2024-04-15 NOTE — Progress Notes (Signed)
 Pharmacy Note  Subjective: Patient presents today to [facility] for follow up office visit. Patient seen by the pharmacist for counseling on methotrexate  for rheumatoid arthritis. Prior therapy includes:hydroxychloroquine .  Objective: CBC    Component Value Date/Time   WBC 9.7 04/10/2024 0943   RBC 4.93 04/10/2024 0943   HGB 15.8 04/10/2024 0943   HCT 46.5 04/10/2024 0943   PLT 336 04/10/2024 0943   MCV 94.3 04/10/2024 0943   MCH 32.0 04/10/2024 0943   MCHC 34.0 04/10/2024 0943   RDW 12.0 04/10/2024 0943   LYMPHSABS 1,244 05/02/2023 1433   MONOABS 2.0 (H) 06/16/2016 2257   EOSABS 301 04/10/2024 0943   BASOSABS 39 04/10/2024 0943    CMP     Component Value Date/Time   NA 140 04/10/2024 0943   K 3.8 04/10/2024 0943   CL 104 04/10/2024 0943   CO2 27 04/10/2024 0943   GLUCOSE 83 04/10/2024 0943   BUN 16 04/10/2024 0943   CREATININE 0.70 04/10/2024 0943   CALCIUM 9.3 04/10/2024 0943   PROT 7.3 04/10/2024 0943   PROT 7.3 04/10/2024 0943   ALBUMIN 4.2 12/20/2021 1934   AST 15 04/10/2024 0943   ALT 15 04/10/2024 0943   ALKPHOS 100 12/20/2021 1934   BILITOT 0.3 04/10/2024 0943   GFRNONAA >60 12/20/2021 1934   GFRAA >60 09/07/2016 1132    Baseline Immunosuppressant Therapy Labs TB GOLD    Latest Ref Rng & Units 04/10/2024    9:43 AM  Quantiferon TB Gold  Quantiferon TB Gold Plus NEGATIVE NEGATIVE    Hepatitis Panel    Latest Ref Rng & Units 04/10/2024    9:43 AM  Hepatitis  Hep B IgM NON-REACTIVE NON-REACTIVE   Hep C Ab NON-REACTIVE NON-REACTIVE   Hep A IgM NON-REACTIVE NON-REACTIVE    HIV No results found for: HIV Immunoglobulins    Latest Ref Rng & Units 04/10/2024    9:43 AM  Immunoglobulin Electrophoresis  IgA  47 - 310 mg/dL 756   IgG 399 - 8,359 mg/dL 8,584   IgM 50 - 699 mg/dL 51    SPEP    Latest Ref Rng & Units 04/10/2024    9:43 AM  Serum Protein Electrophoresis  Total Protein 6.1 - 8.1 g/dL 6.1 - 8.1 g/dL 7.3    7.3   Albumin  3.8 - 4.8 g/dL 4.1   Alpha-1 0.2 - 0.3 g/dL 0.3   Alpha-2 0.5 - 0.9 g/dL 0.8   Beta Globulin 0.4 - 0.6 g/dL 0.4   Beta 2 0.2 - 0.5 g/dL 0.4   Gamma Globulin 0.8 - 1.7 g/dL 1.2   Interpretation  --    G6PD No results found for: G6PDH TPMT No results found for: TPMT   Chest-xray:  negative in 2017; reordered today  Contraception: wife has tubal ligation  Alcohol use: never  Assessment/Plan:   Patient was counseled on the purpose, proper use, and adverse effects of methotrexate  including nausea, infection, and signs and symptoms of pneumonitis. Discussed that there is the possibility of an increased risk of malignancy, specifically lymphomas, but it is not well understood if this increased risk is due to the medication or the disease state.  Instructed patient that medication should be held for infection and prior to surgery.  Advised patient to avoid live vaccines. Recommend annual influenza, Pneumovax 23, Prevnar 13, and Shingrix as indicated.   Reviewed instructions with patient to take methotrexate  weekly along with folic acid  daily.  Discussed the importance of frequent monitoring  of kidney and liver function and blood counts, and provided patient with standing lab instructions.  Counseled patient to avoid NSAIDs and alcohol while on methotrexate .  Provided patient with educational materials on methotrexate  and answered all questions.   Patient voiced understanding.  Patient consented to methotrexate  use.  Will upload into chart.    Dose of methotrexate  will be 15mg  po once weekly along with folic acid  2mg  daily x 2 weeks. REPEAT CBC/CMP. If stable, increase to MTX 20mg  po once weekly along with folic acid  2mg  daily. REPEAT CBC/CMP in 2 weeks. REPEAT CBC/CMP every 3 months. Prescription  sent to pharmacy today  Continue hydroxychloroquine  200mg  twice daily.  Chest xray ordered to be updated within the next few months  Sherry Pennant, PharmD, MPH, BCPS, CPP Clinical Pharmacist  (Rheumatology and Pulmonology)

## 2024-04-15 NOTE — Patient Instructions (Signed)
 Please complete chest xray at [facility] We placed an order today for your standing lab work.   Please have your standing labs drawn in 2 weeks x 2, then every 3 months  Please have your labs drawn 2 weeks prior to your appointment so that the provider can discuss your lab results at your appointment, if possible.  Please note that you may see your imaging and lab results in MyChart before we have reviewed them. We will contact you once all results are reviewed. Please allow our office up to 72 hours to thoroughly review all of the results before contacting the office for clarification of your results.  WALK-IN LAB HOURS  Monday through Thursday from 8:00 am -12:30 pm and 1:00 pm-4:30 pm and Friday from 8:00 am-12:00 pm.  Patients with office visits requiring labs will be seen before walk-in labs.  You may encounter longer than normal wait times. Please allow additional time. Wait times may be shorter on  Monday and Thursday afternoons.  We do not book appointments for walk-in labs. We appreciate your patience and understanding with our staff.   Labs are drawn by [facility]. Please bring your co-pay at the time of your lab draw.  You may receive a bill from [facility] for your lab work.  Please note if you are on Hydroxychloroquine  and and an order has been placed for a Hydroxychloroquine  level,  you will need to have it drawn 4 hours or more after your last dose.  If you wish to have your labs drawn at another location, please call the office 24 hours in advance so we can fax the orders.  The office is located at [address], [place], [place] [address]   If you have any questions regarding directions or hours of operation,  please call [phone].   As a reminder, please drink plenty of water prior to coming for your lab work. Thanks!   Methotrexate  Tablets What is this medication? METHOTREXATE  (METH oh TREX ate) treats autoimmune conditions, such as  arthritis and psoriasis. It works by decreasing inflammation, which can reduce pain and prevent long-term injury to the joints and skin. It may also be used to treat some types of cancer. It works by slowing down the growth of cancer cells. This medicine may be used for other purposes; ask your health care provider or pharmacist if you have questions. COMMON BRAND NAME(S): Rheumatrex, Trexall  What should I tell my care team before I take this medication? They need to know if you have any of these conditions: Dehydration Diabetes Fluid in the stomach area or lungs Frequently drink alcohol Having surgery, including dental surgery High cholesterol Immune system problems Inflammatory bowel disease, such as ulcerative colitis Kidney disease Liver disease Low blood cell levels (white cells, red cells, and platelets) Lung disease Recent or ongoing radiation Recent or upcoming vaccine Stomach ulcers, other stomach or intestine problems An unusual or allergic reaction to methotrexate , other medications, foods, dyes, or preservatives Pregnant or trying to get pregnant Breastfeeding How should I use this medication? Take this medication by mouth with water. Take it as directed on the prescription label. Do not take extra. Keep taking this medication until your care team tells you to stop. Know why you are taking this medication and how you should take it. To treat conditions such as arthritis and psoriasis, this medication is taken ONCE A WEEK as a single dose or divided into 3 smaller doses taken 12 hours apart (  do not take more than 3 doses 12 hours apart each week). This medication is NEVER taken daily to treat conditions other than cancer. Taking this medication more often than directed can cause serious side effects, even death. Talk to your care team about why you are taking this medication, how often you will take it, and what your dose is. Ask your care team to put the reason you take this  medication on the prescription. If you take this medication ONCE A WEEK, choose a day of the week before you start. Ask your pharmacist to include the day of the week on the label. Avoid Monday, which could be misread as Morning. Handling this medication may be harmful. Talk to your care team about how to handle this medication. Special instructions may apply. Talk to your care team about the use of this medication in children. While it may be prescribed for selected conditions, precautions do apply. Overdosage: If you think you have taken too much of this medicine contact a poison control center or emergency room at once. NOTE: This medicine is only for you. Do not share this medicine with others. What if I miss a dose? If you miss a dose, talk with your care team. Do not take double or extra doses. What may interact with this medication? Do not take this medication with any of the following: Acitretin Live virus vaccines Probenecid This medication may also interact with the following: Alcohol Aspirin  and aspirin -like medications Certain antibiotics, such as penicillin, neomycin, sulfamethoxazole; trimethoprim Certain medications for stomach problems, such as lansoprazole, omeprazole , pantoprazole  Clozapine Cyclosporine Dapsone Folic acid  Foscarnet NSAIDs, medications for pain and inflammation, such as ibuprofen  or naproxen  Phenytoin Pyrimethamine Steroid medications, such as prednisone  or cortisone Tacrolimus Theophylline This list may not describe all possible interactions. Give your health care provider a list of all the medicines, herbs, non-prescription drugs, or dietary supplements you use. Also tell them if you smoke, drink alcohol, or use illegal drugs. Some items may interact with your medicine. What should I watch for while using this medication? Visit your care team for regular checks on your progress. It may be some time before you see the benefit from this  medication. You may need blood work done while you are taking this medication. If your care team has also prescribed folic acid , they may instruct you to skip your folic acid  dose on the day you take methotrexate . This medication can make you more sensitive to the sun. Keep out of the sun. If you cannot avoid being in the sun, wear protective clothing and sunscreen. Do not use sun lamps, tanning beds, or tanning booths. Check with your care team if you have severe diarrhea, nausea, and vomiting, or if you sweat a lot. The loss of too much body fluid may make it dangerous for you to take this medication. This medication may increase your risk of getting an infection. Call your care team for advice if you get a fever, chills, sore throat, or other symptoms of a cold or flu. Do not treat yourself. Try to avoid being around people who are sick. Talk to your care team about your risk of cancer. You may be more at risk for certain types of cancers if you take this medication. Talk to your care team if you or your partner may be pregnant. Serious birth defects can occur if you take this medication during pregnancy and for 6 months after the last dose. You will need a negative pregnancy test  before starting this medication. Contraception is recommended while taking this medication and for 6 months after the last dose. Your care team can help you find the option that works for you. If your partner can get pregnant, use a condom during sex while taking this medication and for 3 months after the last dose. Do not breastfeed while taking this medication and for 1 week after the last dose. This medication may cause infertility. Talk to your care team if you are concerned about your fertility. What side effects may I notice from receiving this medication? Side effects that you should report to your care team as soon as possible: Allergic reactions--skin rash, itching, hives, swelling of the face, lips, tongue, or  throat Dry cough, shortness of breath or trouble breathing Infection--fever, chills, cough, sore throat, wounds that don't heal, pain or trouble when passing urine, general feeling of discomfort or being unwell Kidney injury--decrease in the amount of urine, swelling of the ankles, hands, or feet Liver injury--right upper belly pain, loss of appetite, nausea, light-colored stool, dark yellow or brown urine, yellowing skin or eyes, unusual weakness or fatigue Low red blood cell level--unusual weakness or fatigue, dizziness, headache, trouble breathing Pain, tingling, or numbness in the hands or feet, muscle weakness, change in vision, confusion or trouble speaking, loss of balance or coordination, trouble walking, seizures Redness, blistering, peeling, or loosening of the skin, including inside the mouth Stomach bleeding--bloody or black, tar-like stools, vomiting blood or brown material that looks like coffee grounds Stomach pain that is severe, does not go away, or gets worse Unusual bruising or bleeding Side effects that usually do not require medical attention (report these to your care team if they continue or are bothersome): Diarrhea Dizziness Hair loss Nausea Pain, redness, or swelling with sores inside the mouth or throat Skin reactions on sun-exposed areas Vomiting This list may not describe all possible side effects. Call your doctor for medical advice about side effects. You may report side effects to FDA at 1-800-FDA-1088. Where should I keep my medication? Keep out of the reach of children and pets. Store at room temperature between 20 and 25 degrees C (68 and 77 degrees F). Protect from light. Keep the container tightly closed. Get rid of any unused medication after the expiration date. To get rid of medications that are no longer needed or have expired: Take the medication to a medication take-back program. Check with your pharmacy or law enforcement to find a location. If you  cannot return the medication, ask your pharmacist or care team how to get rid of this medication safely. NOTE: This sheet is a summary. It may not cover all possible information. If you have questions about this medicine, talk to your doctor, pharmacist, or health care provider.  2024 Elsevier/Gold Standard (2023-08-02 00:00:00)

## 2024-04-23 ENCOUNTER — Telehealth: Payer: Self-pay

## 2024-04-23 DIAGNOSIS — Z79899 Other long term (current) drug therapy: Secondary | ICD-10-CM

## 2024-04-23 NOTE — Telephone Encounter (Signed)
 Patient contacted the office to inquire if he could get his two week labs tomorrow for the methotrexate . After speaking with Waddell, advised the patient he could get the labs tomorrow and that I would go ahead and release the orders. Patient verbalized understanding.

## 2024-04-25 LAB — CBC WITH DIFFERENTIAL/PLATELET
Absolute Lymphocytes: 2728 {cells}/uL (ref 850–3900)
Absolute Monocytes: 979 {cells}/uL — ABNORMAL HIGH (ref 200–950)
Basophils Absolute: 44 {cells}/uL (ref 0–200)
Basophils Relative: 0.4 %
Eosinophils Absolute: 220 {cells}/uL (ref 15–500)
Eosinophils Relative: 2 %
HCT: 45.6 % (ref 38.5–50.0)
Hemoglobin: 15.4 g/dL (ref 13.2–17.1)
MCH: 32 pg (ref 27.0–33.0)
MCHC: 33.8 g/dL (ref 32.0–36.0)
MCV: 94.8 fL (ref 80.0–100.0)
MPV: 9.5 fL (ref 7.5–12.5)
Monocytes Relative: 8.9 %
Neutro Abs: 7029 {cells}/uL (ref 1500–7800)
Neutrophils Relative %: 63.9 %
Platelets: 348 Thousand/uL (ref 140–400)
RBC: 4.81 Million/uL (ref 4.20–5.80)
RDW: 12.4 % (ref 11.0–15.0)
Total Lymphocyte: 24.8 %
WBC: 11 Thousand/uL — ABNORMAL HIGH (ref 3.8–10.8)

## 2024-04-25 LAB — COMPREHENSIVE METABOLIC PANEL WITH GFR
AG Ratio: 1.8 (calc) (ref 1.0–2.5)
ALT: 36 U/L (ref 9–46)
AST: 21 U/L (ref 10–40)
Albumin: 4.6 g/dL (ref 3.6–5.1)
Alkaline phosphatase (APISO): 100 U/L (ref 36–130)
BUN: 23 mg/dL (ref 7–25)
CO2: 28 mmol/L (ref 20–32)
Calcium: 9.3 mg/dL (ref 8.6–10.3)
Chloride: 101 mmol/L (ref 98–110)
Creat: 0.91 mg/dL (ref 0.60–1.29)
Globulin: 2.6 g/dL (ref 1.9–3.7)
Glucose, Bld: 98 mg/dL (ref 65–99)
Potassium: 3.6 mmol/L (ref 3.5–5.3)
Sodium: 140 mmol/L (ref 135–146)
Total Bilirubin: 0.5 mg/dL (ref 0.2–1.2)
Total Protein: 7.2 g/dL (ref 6.1–8.1)
eGFR: 105 mL/min/1.73m2 (ref >=60)

## 2024-04-27 ENCOUNTER — Other Ambulatory Visit: Payer: Self-pay

## 2024-04-27 ENCOUNTER — Ambulatory Visit: Payer: Self-pay | Admitting: Physician Assistant

## 2024-04-27 MED ORDER — PREDNISONE 5 MG PO TABS: ORAL_TABLET | ORAL | 0 refills | Status: AC

## 2024-04-27 NOTE — Telephone Encounter (Signed)
 When advising patient of lab results, patient questioned when he should see results from starting methotrexate . I advised patient we give the medications 3 months to see the efficacy of treatment. Patient has had two doses of methotrexate  at 6 tablets. He is increasing the dose this week to 8 tablets. He completed the prednisone  taper and said the joint pain came back right away. Patient denies joint swelling but reports joint pain in the right knee and wrists. Patient states the same joints he was just seen in the office for. He would like to take tylenol  or know what he could potentially take with the methotrexate  and plaquenil  to help ease the pain. I advised patient NSAIDs are not typically recommended with methotrexate  due to potentially elevating liver functions.  Please advise.

## 2024-04-27 NOTE — Progress Notes (Signed)
 CMP WNL WBC count is borderline elevated-11.0.  absolute monocytes are borderline elevated. Rest of CBC WNL.  We will continue to monitor.

## 2024-04-27 NOTE — Telephone Encounter (Signed)
 Ok to send in another course of prednisone  to bridge him until methotrexate  starts to alleviate his symptoms.  Okay to send in prednisone  20 mg tapering by 5 mg every 4 days.  Take prednisone  in the morning with food and avoid the use of NSAIDs.

## 2024-04-27 NOTE — Telephone Encounter (Signed)
 Please review pended prednisone  rx and send to the pharmacy. Thanks!

## 2024-04-27 NOTE — Telephone Encounter (Signed)
 Advised patient that a prednisone  taper has been sent to the pharmacy to bridge him until methotrexate  starts to alleviate his symptoms. Take prednisone  in the morning with food and avoid the use of NSAIDs. Patient verbalized understanding.

## 2024-05-07 ENCOUNTER — Telehealth: Payer: Self-pay

## 2024-05-07 DIAGNOSIS — Z79899 Other long term (current) drug therapy: Secondary | ICD-10-CM

## 2024-05-07 NOTE — Telephone Encounter (Signed)
 He was given 5 mg tablets--stated at 20 mg tapering by 5 mg every 4 days.  Did someone else prescribe him another course of prednisone ?   Methotrexate  will take 3 months to take full affect. He can schedule a sooner office visit with Dr. Dolphus if he would like to discuss other options

## 2024-05-07 NOTE — Addendum Note (Signed)
 Addended by: CENA ALFONSO CROME on: 05/07/2024 01:45 PM   Modules accepted: Orders

## 2024-05-07 NOTE — Telephone Encounter (Signed)
 LMOM for patient to contact the office in regards to the previous message from [place] .

## 2024-05-07 NOTE — Telephone Encounter (Signed)
 Patient called the office stating that he is in pain and he only noticed this as he takes less of the prednisone . Said when he's taking the 5mg  dose he is fine but as he gets down to 2mg  its like he's back at the start with high levels of. He is taking 8 tablets of methotrexate  an is wondering if the medication is not helping or what he should. He is almost out of the prednisone  an is having a hard time with the pain levels and states he has to be able to work.

## 2024-05-08 ENCOUNTER — Telehealth: Payer: Self-pay | Admitting: *Deleted

## 2024-05-08 LAB — CBC WITH DIFFERENTIAL/PLATELET
Absolute Lymphocytes: 1761 {cells}/uL (ref 850–3900)
Absolute Monocytes: 714 {cells}/uL (ref 200–950)
Basophils Absolute: 27 {cells}/uL (ref 0–200)
Basophils Relative: 0.4 %
Eosinophils Absolute: 109 {cells}/uL (ref 15–500)
Eosinophils Relative: 1.6 %
HCT: 46.2 % (ref 38.5–50.0)
Hemoglobin: 15.7 g/dL (ref 13.2–17.1)
MCH: 32.6 pg (ref 27.0–33.0)
MCHC: 34 g/dL (ref 32.0–36.0)
MCV: 95.9 fL (ref 80.0–100.0)
MPV: 9.4 fL (ref 7.5–12.5)
Monocytes Relative: 10.5 %
Neutro Abs: 4189 {cells}/uL (ref 1500–7800)
Neutrophils Relative %: 61.6 %
Platelets: 238 Thousand/uL (ref 140–400)
RBC: 4.82 Million/uL (ref 4.20–5.80)
RDW: 12.8 % (ref 11.0–15.0)
Total Lymphocyte: 25.9 %
WBC: 6.8 Thousand/uL (ref 3.8–10.8)

## 2024-05-08 LAB — COMPREHENSIVE METABOLIC PANEL WITH GFR
AG Ratio: 1.5 (calc) (ref 1.0–2.5)
ALT: 35 U/L (ref 9–46)
AST: 24 U/L (ref 10–40)
Albumin: 4.3 g/dL (ref 3.6–5.1)
Alkaline phosphatase (APISO): 89 U/L (ref 36–130)
BUN: 22 mg/dL (ref 7–25)
CO2: 32 mmol/L (ref 20–32)
Calcium: 8.9 mg/dL (ref 8.6–10.3)
Chloride: 99 mmol/L (ref 98–110)
Creat: 0.77 mg/dL (ref 0.60–1.29)
Globulin: 2.9 g/dL (ref 1.9–3.7)
Glucose, Bld: 92 mg/dL (ref 65–139)
Potassium: 4 mmol/L (ref 3.5–5.3)
Sodium: 139 mmol/L (ref 135–146)
Total Bilirubin: 0.3 mg/dL (ref 0.2–1.2)
Total Protein: 7.2 g/dL (ref 6.1–8.1)
eGFR: 111 mL/min/{1.73_m2}

## 2024-05-08 MED ORDER — METHOTREXATE SODIUM CHEMO INJECTION 50 MG/2ML: 20.0000 mg | INTRAMUSCULAR | 0 refills | Status: DC

## 2024-05-08 MED ORDER — PREDNISONE 5 MG PO TABS: ORAL_TABLET | ORAL | 0 refills | Status: AC

## 2024-05-08 MED ORDER — "BD TB SYRINGE 27G X 1/2"" 1 ML MISC": 12.0000 | 3 refills | Status: AC

## 2024-05-08 NOTE — Telephone Encounter (Signed)
 Patient advised he was given 5 mg tablets--stated at 20 mg tapering by 5 mg every 4 days.   Patient advised Methotrexate  will take 3 months to take full affect. He can schedule a sooner office visit with Dr. Dolphus if he would like to discuss other options. No sooner appointments available with Dr. Dolphus at this time. Patient states  I can't work in pain. I am unable to walk. Patient states he would like to know what he can take in addition to help with his pain. Patient would like to know if you would like for him to have labs today. Please advise.

## 2024-05-08 NOTE — Telephone Encounter (Signed)
 Patient is asking for a prescription for a knee brace to be faxed to [facility] at [phone]. He states they will file it with his insurance.   Okay to send prescription? Any certain knee brace?

## 2024-05-08 NOTE — Telephone Encounter (Signed)
 Prescription faxed

## 2024-05-08 NOTE — Telephone Encounter (Signed)
 Patient advised lab orders have already been signed today.  Patient advised there is the option of switching from oral to injectable methotrexate  to try to increase efficacy.  Would he like his new prescription to be for vial/syringe methotrexate ? Patient is agreeable to try the injectable MTX   Patient advised we do not prescribed narcotics but can do one more course of prednisone  if needed. Patient would like another prescription for Prednisone .   Prednisone  prescription pended as discussed with Waddell, Prednisone  starting at 20 mg decreasing by 5 mg every 7 days.

## 2024-05-08 NOTE — Telephone Encounter (Signed)
 Lab orders have already been signed today.  Discussed with Francina yesterday the option of switching from oral to injectable methotrexate  to try to increase efficacy.  Would he like his new prescription to be for vial/syringe methotrexate ?   We do not prescribed narcotics but can do one more course of prednisone  if needed.

## 2024-05-08 NOTE — Addendum Note (Signed)
 Addended by: CENA ALFONSO CROME on: 05/08/2024 10:11 AM   Modules accepted: Orders

## 2024-05-08 NOTE — Telephone Encounter (Signed)
 Ok to fax prescription--ok to leave it generic so he can be fitted for an appropriate brace

## 2024-05-10 ENCOUNTER — Ambulatory Visit: Payer: Self-pay | Admitting: Physician Assistant

## 2024-05-10 NOTE — Progress Notes (Signed)
 CBC and CMP WNL

## 2024-05-11 ENCOUNTER — Telehealth: Payer: Self-pay

## 2024-05-11 NOTE — Telephone Encounter (Signed)
 Methotrexate  tablet refill request received via fax from [facility] in [place]. Refill request denied.    Patient was switched to methotrexate  vial and syringe on 05/08/2024.

## 2024-05-12 NOTE — Telephone Encounter (Signed)
 Patient contacted the office to inquire about his methotrexate . Advised the patient the injectable methotrexate  was sent to the [facility] in [place] and to contact the pharmacy to see if his medication is there. Patient verbalized understanding.

## 2024-05-20 ENCOUNTER — Ambulatory Visit (INDEPENDENT_AMBULATORY_CARE_PROVIDER_SITE_OTHER): Admitting: Dermatology

## 2024-05-20 ENCOUNTER — Encounter: Payer: Self-pay | Admitting: Dermatology

## 2024-05-20 VITALS — BP 120/62

## 2024-05-20 DIAGNOSIS — W908XXA Exposure to other nonionizing radiation, initial encounter: Secondary | ICD-10-CM | POA: Diagnosis not present

## 2024-05-20 DIAGNOSIS — D2339 Other benign neoplasm of skin of other parts of face: Secondary | ICD-10-CM | POA: Diagnosis not present

## 2024-05-20 DIAGNOSIS — C44319 Basal cell carcinoma of skin of other parts of face: Secondary | ICD-10-CM

## 2024-05-20 DIAGNOSIS — L578 Other skin changes due to chronic exposure to nonionizing radiation: Secondary | ICD-10-CM | POA: Diagnosis not present

## 2024-05-20 DIAGNOSIS — D485 Neoplasm of uncertain behavior of skin: Secondary | ICD-10-CM | POA: Diagnosis not present

## 2024-05-20 DIAGNOSIS — B079 Viral wart, unspecified: Secondary | ICD-10-CM

## 2024-05-20 NOTE — Patient Instructions (Addendum)

## 2024-05-20 NOTE — Progress Notes (Addendum)
 Office Visit Note  Patient: Jeffery Mckee             Date of Birth: [birth year]           MRN: [id number]             PCP: Vida Mardy DEL, PA-C Referring: Vida Mardy DEL, PA-C Visit Date: 06/03/2024 Occupation: Data Unavailable  Subjective:  Medication monitoring   History of Present Illness: Jeffery Mckee is a 48 y.o. male with history of rheumatoid arthritis.  Patient is currently on Plaquenil  200 mg 1 tablet by mouth twice daily. methotrexate  0.8 ml sq injections once weekly, and folic acid  2 mg daily.  Patient states that he has started to notice clinical benefit from switching from the tablets to injectable methotrexate  as of last week.  Patient states that he has noticed about an 85% improvement in his symptoms.  Patient states that he is currently on prednisone  5 mg which she will take for the next 2 days and then discontinue.  Patient states that his right knee joint pain has been more bearable.  He is no longer needed to use a knee brace or a cane.  He continues to have chronic pain and stiffness in both hands and both wrist joints but overall his symptoms are less severe on the current treatment regimen.  He denies any recent or recurrent infections.  He denies any gaps in therapy.   Activities of Daily Living:  Patient reports morning stiffness for 30-45 minutes.   Patient Reports nocturnal pain.  Difficulty dressing/grooming: Denies Difficulty climbing stairs: Reports Difficulty getting out of chair: Reports Difficulty using hands for taps, buttons, cutlery, and/or writing: Reports  Review of Systems  Constitutional:  Negative for fatigue.  HENT:  Positive for mouth dryness. Negative for mouth sores.   Eyes:  Negative for dryness.  Respiratory:  Negative for shortness of breath.   Cardiovascular:  Negative for chest pain and palpitations.  Gastrointestinal:  Positive for constipation and diarrhea. Negative for blood in stool.  Endocrine: Negative for increased  urination.  Genitourinary:  Negative for involuntary urination.  Musculoskeletal:  Positive for joint pain, joint pain, joint swelling, myalgias, muscle weakness, morning stiffness, muscle tenderness and myalgias. Negative for gait problem.  Skin:  Positive for sensitivity to sunlight. Negative for color change, rash and hair loss.  Allergic/Immunologic: Negative for susceptible to infections.  Neurological:  Negative for dizziness and headaches.  Hematological:  Negative for swollen glands.  Psychiatric/Behavioral:  Positive for sleep disturbance. Negative for depressed mood. The patient is not nervous/anxious.     PMFS History:  Patient Active Problem List   Diagnosis Date Noted   BCC (basal cell carcinoma of skin) 05/25/2024   Rheumatoid arthritis involving multiple sites with positive rheumatoid factor (HCC) 01/14/2024   Primary osteoarthritis of both hands 01/14/2024   Primary osteoarthritis of both feet 01/14/2024   Degeneration of intervertebral disc of lumbar region without discogenic back pain or lower extremity pain 01/14/2024   Colon cancer screening 07/17/2022   Hemorrhoids without complication 07/17/2022   Benign neoplasm of rectum and anal canal 07/17/2022   Benign neoplasm of rectosigmoid junction 07/17/2022   Family history of colon cancer 07/17/2022   Ureteral calculus 12/29/2021   Bilateral renal cysts 12/29/2021    Past Medical History:  Diagnosis Date   Arthritis    BCC (basal cell carcinoma of skin) 05/25/2024   right temple -  Mohs needed    GERD (gastroesophageal reflux disease)  Nephrolithiasis    Obesity    RA (rheumatoid arthritis) (HCC)     Family History  Problem Relation Age of Onset   Hypertension Mother    Osteoarthritis Mother    Kidney Stones Mother    Diabetes Mother    Gout Mother    Neuropathy Mother    Cancer Father    Hypertension Sister    Diabetes Sister    Past Surgical History:  Procedure Laterality Date   CHOLECYSTECTOMY      COLONOSCOPY WITH PROPOFOL  N/A 07/17/2022   Procedure: COLONOSCOPY WITH PROPOFOL ;  Surgeon: Evonnie Dorothyann LABOR, DO;  [facility]: [facility];  Service: General;  Laterality: N/A;   HERNIA REPAIR     MOLE REMOVAL     x5   POLYPECTOMY  07/17/2022   Procedure: POLYPECTOMY INTESTINAL;  Surgeon: Evonnie Dorothyann LABOR, DO;  Location: [facility];  Service: General;;   Social History   Tobacco Use   Smoking status: Every Day    Current packs/day: 1.50    Types: Cigarettes    Passive exposure: Never   Smokeless tobacco: Never  Vaping Use   Vaping status: Never Used  Substance Use Topics   Alcohol use: No   Drug use: No   Social History   Social History Narrative   Not on file     Immunization History  Administered Date(s) Administered   Td 05/19/2014     Objective: Vital Signs: BP 118/77   Pulse 69   Temp 98.3 F (36.8 C)   Resp 14   Ht 5' 5 (1.651 m)   Wt 241 lb 6.4 oz (109.5 kg)   BMI 40.17 kg/m    Physical Exam Vitals and nursing note reviewed.  Constitutional:      Appearance: He is well-developed.  HENT:     Head: Normocephalic and atraumatic.  Eyes:     Conjunctiva/sclera: Conjunctivae normal.     Pupils: Pupils are equal, round, and reactive to light.  Cardiovascular:     Rate and Rhythm: Normal rate and regular rhythm.     Heart sounds: Normal heart sounds.  Pulmonary:     Effort: Pulmonary effort is normal.     Breath sounds: Normal breath sounds.  Abdominal:     General: Bowel sounds are normal.     Palpations: Abdomen is soft.  Musculoskeletal:     Cervical back: Normal range of motion and neck supple.  Skin:    General: Skin is warm and dry.     Capillary Refill: Capillary refill takes less than 2 seconds.  Neurological:     Mental Status: He is alert and oriented to person, place, and time.  Psychiatric:        Behavior: Behavior normal.      Musculoskeletal Exam: C-spine, thoracic spine, lumbar spine have good range of  motion.  No midline spinal tenderness.  No SI joint tenderness.  Shoulder joints have good range of motion.  Elbow joints have good range of motion with no tenderness along the joint line.  Limited extension of both wrist joints.  Mild tenderness of the right wrist.  PIP and DIP thickening consistent with osteoarthritis of both hands noted.  MCPs, PIPs, DIPs have good range of motion with no synovitis.  Complete fist formation bilaterally.  Hip joints have good range of motion with no groin pain.  Synovial thickening of the right knee.  Left knee has no warmth or effusion.  Ankle joints have good range of motion no  tenderness or joint swelling.  No evidence of Achilles tendinitis or plantar fasciitis.   CDAI Exam: CDAI Score: -- Patient Global: --; Provider Global: -- Swollen: --; Tender: -- Joint Exam 06/03/2024   No joint exam has been documented for this visit   There is currently no information documented on the homunculus. Go to the Rheumatology activity and complete the homunculus joint exam.  Investigation: No additional findings.  Imaging: No results found.  Recent Labs: Lab Results  Component Value Date   WBC 11.0 (H) 06/03/2024   HGB 15.5 06/03/2024   PLT 288 06/03/2024   NA 142 06/03/2024   K 4.5 06/03/2024   CL 105 06/03/2024   CO2 30 06/03/2024   GLUCOSE 96 06/03/2024   BUN 17 06/03/2024   CREATININE 0.87 06/03/2024   BILITOT 0.4 06/03/2024   ALKPHOS 100 12/20/2021   AST 15 06/03/2024   ALT 24 06/03/2024   PROT 7.0 06/03/2024   ALBUMIN 4.2 12/20/2021   CALCIUM 9.5 06/03/2024   GFRAA >60 09/07/2016   QFTBGOLDPLUS NEGATIVE 04/10/2024    Speciality Comments: PLQ Eye Exam: [facility]   Procedures:  No procedures performed Allergies: Penicillins and Morphine  and codeine   Assessment / Plan:     Visit Diagnoses: Rheumatoid arthritis involving multiple sites with positive rheumatoid factor (HCC) - RF 14, anti-CCP  176, ESR 18: He has noticed an 85% improvement in his symptoms since switching from oral to injectable methotrexate .  He is currently on methotrexate  0.8 mg subcu injections once weekly, folic acid  2 mg daily, and Plaquenil  200 mg 1 tablet by mouth twice daily.  He is tolerating combination therapy without any side effects and has not had any gaps in therapy.  No recent or recurrent infections.  Patient is no longer needing to use a right knee brace.  He is no longer using a cane.  He has been following the prednisone  taper as prescribed and is currently taking 5 mg which she will take for the next 2 days and discontinue.  He will remain on methotrexate  and Plaquenil  as combination therapy.  He was advised to notify us  if he has a recurrence of symptoms.  He will follow-up in the office in 2 months or sooner if needed.  High risk medication use - Plaquenil  200 mg 1 tablet by mouth twice daily.  methotrexate  0.8 ml sq injections once weekly, folic acid  2 mg daily  CBC and CMP updated on 05/08/24.  Orders for CBC and CMP were released today. Hemoglobin A1c updated today Lipid panel updated on 01/14/24. PLQ Eye Exam: 03/25/2023 WNL @ [facility] Follow up in 1 year.  Due to update plaquenil  eye examination. No recent or recurrent infections.  Discussed the importance of holding methotrexate  if he develops signs or symptoms of an infection and to resume once the infection has completely cleared.   - Plan: HgB A1c, CBC with Differential/Platelet, Comprehensive metabolic panel with GFR  Diabetes mellitus screening -Plan to check hemoglobin A1c today.  Plan: HgB A1c  Long term systemic steroid user -Patient has required prednisone  tapers this summer to alleviate frequent flares.  Plan to check hemoglobin A1c today.  Plan: HgB A1c  Positive anti-CCP test - 176. Family history of RA-mother.  Primary osteoarthritis of both hands: PIP and DIP thickening noted.  Some tenderness but no active inflammation  noted on examination.  Limited extension of both wrist joints.  Chronic pain of right knee:  He has started to notice an improvement in his right knee joint pain and stiffness since switching from the tablets to injectable methotrexate .  Patient requires a right knee brace as he has arthritis of the right knee and for support and stabilization.  On examination he has synovial thickening of the right knee.  He is currently taking prednisone  5 mg and has 2 days left of the taper.  He will remain on methotrexate  and Plaquenil  as combination therapy.  He is advised to notify us  if he develops any new or worsening symptoms.  Primary osteoarthritis of both feet: Good range of motion of both ankle joints with no tenderness or joint swelling at this time.  Degeneration of intervertebral disc of lumbar region without discogenic back pain or lower extremity pain: Patient continues to have chronic pain in his lower back.  Other medical conditions are listed as follows:  Family history of rheumatoid arthritis-mother  Family history of colon cancer-father  Onychomycosis  Ureteral calculus  History of gastroesophageal reflux (GERD)  Smoker  Dyslipidemia   Orders: Orders Placed This Encounter  Procedures   HgB A1c   CBC with Differential/Platelet   Comprehensive metabolic panel with GFR   No orders of the defined types were placed in this encounter.   Follow-Up Instructions: Return in about 2 months (around 08/03/2024).   Waddell CHRISTELLA Craze, PA-C  Note - This record has been created using Dragon software.  Chart creation errors have been sought, but may not always  have been located. Such creation errors do not reflect on  the standard of medical care.

## 2024-05-20 NOTE — Progress Notes (Signed)
 New Patient Visit   Subjective  Jeffery Mckee is a 48 y.o. male who presents for a NEW PATIENT appointment to be examined for the concerns as listed below.   Spot Check: Patient has spots on the face that he would like evaluated. He stated the all have developed within the last year. The are not painful but he is concerned about them.    No Hx of Bx.  No family Hx of skin cancer.    The following portions of the chart were reviewed this encounter and updated as appropriate: medications, allergies, medical history  Review of Systems:  No other skin or systemic complaints except as noted in HPI or Assessment and Plan.  Objective  Well appearing patient in no apparent distress; mood and affect are within normal limits.   A focused examination was performed of the following areas: face   Relevant exam findings are noted in the Assessment and Plan.          Left Forehead lateral 4mm pink papule Left Forehead medial 4mm pink papule Right Temple 1cm pink papule Mid Forehead (3) Verrucous papules   Assessment & Plan   ACTINIC DAMAGE - chronic, secondary to cumulative UV radiation exposure/sun exposure over time - diffuse scaly erythematous macules with underlying dyspigmentation - Recommend daily broad spectrum sunscreen SPF 30+ to sun-exposed areas, reapply every 2 hours as needed.  - Recommend staying in the shade or wearing long sleeves, sun glasses (UVA+UVB protection) and wide brim hats (4-inch brim around the entire circumference of the hat). - Call for new or changing lesions.   NEOPLASM OF UNCERTAIN BEHAVIOR OF SKIN (3) Left Forehead lateral Skin / nail biopsy Type of biopsy: tangential   Informed consent: discussed and consent obtained   Timeout: patient name, date of birth, surgical site, and procedure verified   Procedure prep:  Patient was prepped and draped in usual sterile fashion Prep type:  Isopropyl alcohol Anesthesia: the lesion was  anesthetized in a standard fashion   Anesthetic:  1% lidocaine  w/ epinephrine  1-100,000 buffered w/ 8.4% NaHCO3 Instrument used: DermaBlade   Hemostasis achieved with: aluminum chloride   Outcome: patient tolerated procedure well   Post-procedure details: sterile dressing applied and wound care instructions given   Dressing type: petrolatum gauze and bandage    Specimen 1 - Surgical pathology Differential Diagnosis: r/o NMSC  Check Margins: No Left Forehead medial Skin / nail biopsy Type of biopsy: tangential   Informed consent: discussed and consent obtained   Timeout: patient name, date of birth, surgical site, and procedure verified   Procedure prep:  Patient was prepped and draped in usual sterile fashion Prep type:  Isopropyl alcohol Anesthesia: the lesion was anesthetized in a standard fashion   Anesthetic:  1% lidocaine  w/ epinephrine  1-100,000 buffered w/ 8.4% NaHCO3 Instrument used: DermaBlade   Hemostasis achieved with: aluminum chloride   Outcome: patient tolerated procedure well   Post-procedure details: sterile dressing applied and wound care instructions given   Dressing type: petrolatum gauze and bandage    Specimen 2 - Surgical pathology Differential Diagnosis: r/o NMSC  Check Margins: yes Right Temple Skin / nail biopsy Type of biopsy: tangential   Informed consent: discussed and consent obtained   Timeout: patient name, date of birth, surgical site, and procedure verified   Procedure prep:  Patient was prepped and draped in usual sterile fashion Prep type:  Isopropyl alcohol Anesthesia: the lesion was anesthetized in a standard fashion   Anesthetic:  1%  lidocaine  w/ epinephrine  1-100,000 buffered w/ 8.4% NaHCO3 Instrument used: DermaBlade   Hemostasis achieved with: aluminum chloride   Outcome: patient tolerated procedure well   Post-procedure details: sterile dressing applied and wound care instructions given   Dressing type: petrolatum gauze and bandage     Specimen 3 - Surgical pathology Differential Diagnosis: r/o NMSC  Check Margins: Yes VIRAL WARTS, UNSPECIFIED TYPE (3) Mid Forehead (3)  Return in about 4 months (around 09/19/2024) for TBSE.   Documentation: I have reviewed the above documentation for accuracy and completeness, and I agree with the above.  I, Shirron Maranda, CMA, am acting as scribe for [employer], DO.   Delon Lenis, DO

## 2024-05-22 LAB — SURGICAL PATHOLOGY

## 2024-05-25 ENCOUNTER — Ambulatory Visit: Payer: Self-pay | Admitting: Dermatology

## 2024-05-25 DIAGNOSIS — C4491 Basal cell carcinoma of skin, unspecified: Secondary | ICD-10-CM | POA: Insufficient documentation

## 2024-05-25 NOTE — Progress Notes (Signed)
 HI Jeffery Mckee,   Please call patient and notify that results of biopsy on right temple were positive for a skin cancer that needs to be treated with Mohs Surgery.  We will refer to Dr. Corey  The 2 other lesions on the forehead were benign      1. Skin, left forehead lateral : Benign      NEUROFIBROMA, BASE INVOLVED        2. Skin, left forehead medial :   Benign      NEUROFIBROMA, BASE INVOLVED        3. Skin, right temple :   --> Mohs      BASAL CELL CARCINOMA, NODULAR PATTERN, BASE INVOLVED

## 2024-06-01 NOTE — Progress Notes (Deleted)
 Office Visit Note  Patient: Jeffery Mckee             Date of Birth: 13-May-1976           MRN: [id number]             PCP: Vida Mardy DEL, PA-C Referring: Vida Mardy DEL, PA-C Visit Date: 06/15/2024 Occupation: Data Unavailable  Subjective:  No chief complaint on file.   History of Present Illness: Jeffery Mckee is a 48 y.o. male ***     Activities of Daily Living:  Patient reports morning stiffness for *** {minute/hour:19697}.   Patient {ACTIONS;DENIES/REPORTS:21021675::Denies} nocturnal pain.  Difficulty dressing/grooming: {ACTIONS;DENIES/REPORTS:21021675::Denies} Difficulty climbing stairs: {ACTIONS;DENIES/REPORTS:21021675::Denies} Difficulty getting out of chair: {ACTIONS;DENIES/REPORTS:21021675::Denies} Difficulty using hands for taps, buttons, cutlery, and/or writing: {ACTIONS;DENIES/REPORTS:21021675::Denies}  No Rheumatology ROS completed.   PMFS History:  Patient Active Problem List   Diagnosis Date Noted  . BCC (basal cell carcinoma of skin) 05/25/2024  . Rheumatoid arthritis involving multiple sites with positive rheumatoid factor (HCC) 01/14/2024  . Primary osteoarthritis of both hands 01/14/2024  . Primary osteoarthritis of both feet 01/14/2024  . Degeneration of intervertebral disc of lumbar region without discogenic back pain or lower extremity pain 01/14/2024  . Colon cancer screening 07/17/2022  . Hemorrhoids without complication 07/17/2022  . Benign neoplasm of rectum and anal canal 07/17/2022  . Benign neoplasm of rectosigmoid junction 07/17/2022  . Family history of colon cancer 07/17/2022  . Ureteral calculus 12/29/2021  . Bilateral renal cysts 12/29/2021    Past Medical History:  Diagnosis Date  . Arthritis   . BCC (basal cell carcinoma of skin) 05/25/2024   right temple -  Mohs needed   . GERD (gastroesophageal reflux disease)   . Nephrolithiasis   . Obesity   . RA (rheumatoid arthritis) (HCC)     Family History   Problem Relation Age of Onset  . Hypertension Mother   . Osteoarthritis Mother   . Kidney Stones Mother   . Diabetes Mother   . Gout Mother   . Neuropathy Mother   . Cancer Father   . Hypertension Sister   . Diabetes Sister    Past Surgical History:  Procedure Laterality Date  . CHOLECYSTECTOMY    . COLONOSCOPY WITH PROPOFOL  N/A 07/17/2022   Procedure: COLONOSCOPY WITH PROPOFOL ;  Surgeon: Evonnie Dorothyann LABOR, DO;  Location: [facility];  Service: General;  Laterality: N/A;  . HERNIA REPAIR    . POLYPECTOMY  07/17/2022   Procedure: POLYPECTOMY INTESTINAL;  Surgeon: Evonnie Dorothyann LABOR, DO;  Location: [facility];  Service: General;;   Social History   Tobacco Use  . Smoking status: Every Day    Current packs/day: 1.50    Types: Cigarettes    Passive exposure: Never  . Smokeless tobacco: Never  Vaping Use  . Vaping status: Never Used  Substance Use Topics  . Alcohol use: No  . Drug use: No   Social History   Social History Narrative  . Not on file     Immunization History  Administered Date(s) Administered  . Td 05/19/2014     Objective: Vital Signs: There were no vitals taken for this visit.   Physical Exam   Musculoskeletal Exam: ***  CDAI Exam: CDAI Score: -- Patient Global: --; Provider Global: -- Swollen: --; Tender: -- Joint Exam 06/15/2024   No joint exam has been documented for this visit   There is currently no information documented on the homunculus. Go to the Rheumatology activity  and complete the homunculus joint exam.  Investigation: No additional findings.  Imaging: No results found.  Recent Labs: Lab Results  Component Value Date   WBC 6.8 05/08/2024   HGB 15.7 05/08/2024   PLT 238 05/08/2024   NA 139 05/08/2024   K 4.0 05/08/2024   CL 99 05/08/2024   CO2 32 05/08/2024   GLUCOSE 92 05/08/2024   BUN 22 05/08/2024   CREATININE 0.77 05/08/2024   BILITOT 0.3 05/08/2024   ALKPHOS 100 12/20/2021   AST 24  05/08/2024   ALT 35 05/08/2024   PROT 7.2 05/08/2024   ALBUMIN 4.2 12/20/2021   CALCIUM 8.9 05/08/2024   GFRAA >60 09/07/2016   QFTBGOLDPLUS NEGATIVE 04/10/2024    Speciality Comments: PLQ Eye Exam: 03/25/2023 WNL @ [facility] Follow up in 1 year   Procedures:  No procedures performed Allergies: Penicillins and Morphine  and codeine   Assessment / Plan:     Visit Diagnoses: Rheumatoid arthritis involving multiple sites with positive rheumatoid factor (HCC)  High risk medication use  Positive anti-CCP test  Primary osteoarthritis of both hands  Chronic pain of right knee  Primary osteoarthritis of both feet  Degeneration of intervertebral disc of lumbar region without discogenic back pain or lower extremity pain  Family history of rheumatoid arthritis-mother  Family history of colon cancer-father  Onychomycosis  Ureteral calculus  History of gastroesophageal reflux (GERD)  Smoker  Dyslipidemia  Orders: No orders of the defined types were placed in this encounter.  No orders of the defined types were placed in this encounter.   Face-to-face time spent with patient was *** minutes. Greater than 50% of time was spent in counseling and coordination of care.  Follow-Up Instructions: No follow-ups on file.   Waddell CHRISTELLA Craze, PA-C  Note - This record has been created using Dragon software.  Chart creation errors have been sought, but may not always  have been located. Such creation errors do not reflect on  the standard of medical care.

## 2024-06-03 ENCOUNTER — Ambulatory Visit: Attending: Physician Assistant | Admitting: Physician Assistant

## 2024-06-03 ENCOUNTER — Encounter: Payer: Self-pay | Admitting: Physician Assistant

## 2024-06-03 VITALS — BP 118/77 | HR 69 | Temp 98.3°F | Resp 14 | Ht 65.0 in | Wt 241.4 lb

## 2024-06-03 DIAGNOSIS — Z8719 Personal history of other diseases of the digestive system: Secondary | ICD-10-CM

## 2024-06-03 DIAGNOSIS — G8929 Other chronic pain: Secondary | ICD-10-CM

## 2024-06-03 DIAGNOSIS — Z79899 Other long term (current) drug therapy: Secondary | ICD-10-CM

## 2024-06-03 DIAGNOSIS — N201 Calculus of ureter: Secondary | ICD-10-CM

## 2024-06-03 DIAGNOSIS — M25561 Pain in right knee: Secondary | ICD-10-CM

## 2024-06-03 DIAGNOSIS — Z131 Encounter for screening for diabetes mellitus: Secondary | ICD-10-CM

## 2024-06-03 DIAGNOSIS — M51369 Other intervertebral disc degeneration, lumbar region without mention of lumbar back pain or lower extremity pain: Secondary | ICD-10-CM

## 2024-06-03 DIAGNOSIS — Z7952 Long term (current) use of systemic steroids: Secondary | ICD-10-CM

## 2024-06-03 DIAGNOSIS — Z800 Family history of malignant neoplasm of digestive organs: Secondary | ICD-10-CM

## 2024-06-03 DIAGNOSIS — M19041 Primary osteoarthritis, right hand: Secondary | ICD-10-CM

## 2024-06-03 DIAGNOSIS — M19071 Primary osteoarthritis, right ankle and foot: Secondary | ICD-10-CM

## 2024-06-03 DIAGNOSIS — R7681 Abnormal rheumatoid factor and anti-citrullinated protein antibody without rheumatoid arthritis: Secondary | ICD-10-CM

## 2024-06-03 DIAGNOSIS — M0579 Rheumatoid arthritis with rheumatoid factor of multiple sites without organ or systems involvement: Secondary | ICD-10-CM

## 2024-06-03 DIAGNOSIS — M19072 Primary osteoarthritis, left ankle and foot: Secondary | ICD-10-CM

## 2024-06-03 DIAGNOSIS — E785 Hyperlipidemia, unspecified: Secondary | ICD-10-CM

## 2024-06-03 DIAGNOSIS — F17200 Nicotine dependence, unspecified, uncomplicated: Secondary | ICD-10-CM

## 2024-06-03 DIAGNOSIS — R768 Other specified abnormal immunological findings in serum: Secondary | ICD-10-CM

## 2024-06-03 DIAGNOSIS — M19042 Primary osteoarthritis, left hand: Secondary | ICD-10-CM

## 2024-06-03 DIAGNOSIS — B351 Tinea unguium: Secondary | ICD-10-CM

## 2024-06-03 DIAGNOSIS — Z8261 Family history of arthritis: Secondary | ICD-10-CM

## 2024-06-04 ENCOUNTER — Ambulatory Visit: Payer: Self-pay | Admitting: Physician Assistant

## 2024-06-04 LAB — COMPREHENSIVE METABOLIC PANEL WITH GFR
AG Ratio: 1.7 (calc) (ref 1.0–2.5)
ALT: 24 U/L (ref 9–46)
AST: 15 U/L (ref 10–40)
Albumin: 4.4 g/dL (ref 3.6–5.1)
Alkaline phosphatase (APISO): 93 U/L (ref 36–130)
BUN: 17 mg/dL (ref 7–25)
CO2: 30 mmol/L (ref 20–32)
Calcium: 9.5 mg/dL (ref 8.6–10.3)
Chloride: 105 mmol/L (ref 98–110)
Creat: 0.87 mg/dL (ref 0.60–1.29)
Globulin: 2.6 g/dL (ref 1.9–3.7)
Glucose, Bld: 96 mg/dL (ref 65–139)
Potassium: 4.5 mmol/L (ref 3.5–5.3)
Sodium: 142 mmol/L (ref 135–146)
Total Bilirubin: 0.4 mg/dL (ref 0.2–1.2)
Total Protein: 7 g/dL (ref 6.1–8.1)
eGFR: 107 mL/min/1.73m2 (ref >=60)

## 2024-06-04 LAB — CBC WITH DIFFERENTIAL/PLATELET
Absolute Lymphocytes: 924 {cells}/uL (ref 850–3900)
Absolute Monocytes: 1089 {cells}/uL — ABNORMAL HIGH (ref 200–950)
Basophils Absolute: 44 {cells}/uL (ref 0–200)
Basophils Relative: 0.4 %
Eosinophils Absolute: 88 {cells}/uL (ref 15–500)
Eosinophils Relative: 0.8 %
HCT: 43.8 % (ref 38.5–50.0)
Hemoglobin: 15.5 g/dL (ref 13.2–17.1)
MCH: 33.8 pg — ABNORMAL HIGH (ref 27.0–33.0)
MCHC: 35.4 g/dL (ref 32.0–36.0)
MCV: 95.6 fL (ref 80.0–100.0)
MPV: 9.9 fL (ref 7.5–12.5)
Monocytes Relative: 9.9 %
Neutro Abs: 8855 {cells}/uL — ABNORMAL HIGH (ref 1500–7800)
Neutrophils Relative %: 80.5 %
Platelets: 288 Thousand/uL (ref 140–400)
RBC: 4.58 Million/uL (ref 4.20–5.80)
RDW: 13 % (ref 11.0–15.0)
Total Lymphocyte: 8.4 %
WBC: 11 Thousand/uL — ABNORMAL HIGH (ref 3.8–10.8)

## 2024-06-04 LAB — HEMOGLOBIN A1C
Hgb A1c MFr Bld: 5.5 % (ref <5.7)
Mean Plasma Glucose: 111 mg/dL
eAG (mmol/L): 6.2 mmol/L

## 2024-06-04 NOTE — Progress Notes (Signed)
 WBC count is slightly elevated-absolute neutrophils and monocytes are elevated-likely related to being on prednisone . Rest of CBC WNL.   CMP WNL Hgb A1c is 5.5%.

## 2024-06-15 ENCOUNTER — Ambulatory Visit: Admitting: Physician Assistant

## 2024-06-15 DIAGNOSIS — M19072 Primary osteoarthritis, left ankle and foot: Secondary | ICD-10-CM

## 2024-06-15 DIAGNOSIS — F17200 Nicotine dependence, unspecified, uncomplicated: Secondary | ICD-10-CM

## 2024-06-15 DIAGNOSIS — Z8719 Personal history of other diseases of the digestive system: Secondary | ICD-10-CM

## 2024-06-15 DIAGNOSIS — M0579 Rheumatoid arthritis with rheumatoid factor of multiple sites without organ or systems involvement: Secondary | ICD-10-CM

## 2024-06-15 DIAGNOSIS — M19041 Primary osteoarthritis, right hand: Secondary | ICD-10-CM

## 2024-06-15 DIAGNOSIS — Z800 Family history of malignant neoplasm of digestive organs: Secondary | ICD-10-CM

## 2024-06-15 DIAGNOSIS — E785 Hyperlipidemia, unspecified: Secondary | ICD-10-CM

## 2024-06-15 DIAGNOSIS — Z79899 Other long term (current) drug therapy: Secondary | ICD-10-CM

## 2024-06-15 DIAGNOSIS — G8929 Other chronic pain: Secondary | ICD-10-CM

## 2024-06-15 DIAGNOSIS — N201 Calculus of ureter: Secondary | ICD-10-CM

## 2024-06-15 DIAGNOSIS — B351 Tinea unguium: Secondary | ICD-10-CM

## 2024-06-15 DIAGNOSIS — R768 Other specified abnormal immunological findings in serum: Secondary | ICD-10-CM

## 2024-06-15 DIAGNOSIS — Z8261 Family history of arthritis: Secondary | ICD-10-CM

## 2024-06-15 DIAGNOSIS — M51369 Other intervertebral disc degeneration, lumbar region without mention of lumbar back pain or lower extremity pain: Secondary | ICD-10-CM

## 2024-06-23 ENCOUNTER — Encounter: Payer: Self-pay | Admitting: Dermatology

## 2024-06-25 ENCOUNTER — Encounter: Payer: Self-pay | Admitting: Dermatology

## 2024-06-25 ENCOUNTER — Ambulatory Visit (INDEPENDENT_AMBULATORY_CARE_PROVIDER_SITE_OTHER): Admitting: Dermatology

## 2024-06-25 VITALS — BP 122/77 | HR 63 | Temp 98.0°F

## 2024-06-25 DIAGNOSIS — C4491 Basal cell carcinoma of skin, unspecified: Secondary | ICD-10-CM

## 2024-06-25 DIAGNOSIS — L578 Other skin changes due to chronic exposure to nonionizing radiation: Secondary | ICD-10-CM | POA: Diagnosis not present

## 2024-06-25 DIAGNOSIS — L814 Other melanin hyperpigmentation: Secondary | ICD-10-CM | POA: Diagnosis not present

## 2024-06-25 DIAGNOSIS — C44319 Basal cell carcinoma of skin of other parts of face: Secondary | ICD-10-CM

## 2024-06-25 NOTE — Patient Instructions (Signed)

## 2024-06-25 NOTE — Progress Notes (Unsigned)
   Follow-Up Visit   Subjective  Jeffery Mckee is a 48 y.o. male who presents for the following: mohs of right temple  The following portions of the chart were reviewed this encounter and updated as appropriate: medications, allergies, medical history  Review of Systems:  No other skin or systemic complaints except as noted in HPI or Assessment and Plan.  Objective  Well appearing patient in no apparent distress; mood and affect are within normal limits.  A focused examination was performed of the following areas: Right temple Relevant physical exam findings are noted in the Assessment and Plan.     Assessment & Plan   BASAL CELL CARCINOMA (BCC), UNSPECIFIED SITE Right Temple Mohs surgery  Consent obtained: written  Anticoagulation: Was the anticoagulation regimen changed prior to Mohs? No    Procedure Details: Timeout: pre-procedure verification complete Procedure Prep: patient was prepped and draped in usual sterile fashion Biopsy accession number: [id number] Pre-Op diagnosis: basal cell carcinoma BCC subtype: nodular Surgical site (from skin exam): Right Temple Pre-operative length (cm): 0.8 Pre-operative width (cm): 0.8  Micrographic Surgery Details: Post-operative length (cm): 1.5 Post-operative width (cm): 1.5 Number of Mohs stages: 2  Stage 2    Depth of tumor invasion after stage: dermis and subcutaneous fat  Skin repair Complexity:  Complex Final length (cm):  4.8 Subcutaneous layers (deep stitches):  Suture size:  5-0 Suture type: Monocryl (poliglecaprone 25)   Fine/surface layer approximation (top stitches):  Suture size:  6-0 Suture type: fast-absorbing plain gut      Return in about 4 weeks (around 07/23/2024) for wound check.  I, Darice Smock, CMA, am acting as scribe for RUFUS CHRISTELLA HOLY, MD.   Documentation: I have reviewed the above documentation for accuracy and completeness, and I agree with the above.  RUFUS CHRISTELLA HOLY, MD

## 2024-07-02 ENCOUNTER — Telehealth: Payer: Self-pay

## 2024-07-02 ENCOUNTER — Encounter: Payer: Self-pay | Admitting: Dermatology

## 2024-07-02 NOTE — Telephone Encounter (Signed)
 Received a fax from [facility] in [place] requesting a refill of Methotrexate . A 3 month supply of methotrexate  was sent in on 05/08/2024. Contacted the pharmacy and the pharmacy states they have been giving the patient 1 mL vials and that the patient was getting 0.8 mL out of the vials and throwing away the 0.2. Contacted the patient and patient states the vials are not single use and he is using all of the medication out of them that he can. Patient states he will call the office when he needs a refill.

## 2024-07-20 NOTE — Progress Notes (Deleted)
 Office Visit Note  Patient: Jeffery Mckee             Date of Birth: [birth year]           MRN: [id number]             PCP: Vida Mardy DEL, PA-C Referring: Vida Mardy DEL, PA-C Visit Date: 08/03/2024 Occupation: Data Unavailable  Subjective:    History of Present Illness: Jeffery Mckee is a 48 y.o. male with history of seropositive rheumatoid arthritis and osteoarthritis.  Patient remains on  Plaquenil  200 mg 1 tablet by mouth twice daily. methotrexate  0.8 ml sq injections once weekly, folic acid  2 mg daily.    Discussed the importance of holding methotrexate  if he develops signs or symptoms of an infection and to resume once the infection has completely cleared.   CBC and CMP updated on 06/03/24.  PLQ Eye Exam: 03/25/2023 WNL @ [facility] Follow up in 1 year    Activities of Daily Living:  Patient reports morning stiffness for *** {minute/hour:19697}.   Patient {ACTIONS;DENIES/REPORTS:21021675::Denies} nocturnal pain.  Difficulty dressing/grooming: {ACTIONS;DENIES/REPORTS:21021675::Denies} Difficulty climbing stairs: {ACTIONS;DENIES/REPORTS:21021675::Denies} Difficulty getting out of chair: {ACTIONS;DENIES/REPORTS:21021675::Denies} Difficulty using hands for taps, buttons, cutlery, and/or writing: {ACTIONS;DENIES/REPORTS:21021675::Denies}  No Rheumatology ROS completed.   PMFS History:  Patient Active Problem List   Diagnosis Date Noted   BCC (basal cell carcinoma of skin) 05/25/2024   Rheumatoid arthritis involving multiple sites with positive rheumatoid factor (HCC) 01/14/2024   Primary osteoarthritis of both hands 01/14/2024   Primary osteoarthritis of both feet 01/14/2024   Degeneration of intervertebral disc of lumbar region without discogenic back pain or lower extremity pain 01/14/2024   Colon cancer screening 07/17/2022   Hemorrhoids without complication 07/17/2022   Benign neoplasm of rectum and anal canal 07/17/2022   Benign neoplasm of  rectosigmoid junction 07/17/2022   Family history of colon cancer 07/17/2022   Ureteral calculus 12/29/2021   Bilateral renal cysts 12/29/2021    Past Medical History:  Diagnosis Date   Arthritis    BCC (basal cell carcinoma of skin) 05/25/2024   right temple -  Mohs needed    GERD (gastroesophageal reflux disease)    Nephrolithiasis    Obesity    RA (rheumatoid arthritis) (HCC)     Family History  Problem Relation Age of Onset   Hypertension Mother    Osteoarthritis Mother    Kidney Stones Mother    Diabetes Mother    Gout Mother    Neuropathy Mother    Cancer Father    Hypertension Sister    Diabetes Sister    Past Surgical History:  Procedure Laterality Date   CHOLECYSTECTOMY     COLONOSCOPY WITH PROPOFOL  N/A 07/17/2022   Procedure: COLONOSCOPY WITH PROPOFOL ;  Surgeon: Evonnie Dorothyann LABOR, DO;  [facility];  Service: General;  Laterality: N/A;   HERNIA REPAIR     MOLE REMOVAL     x5   POLYPECTOMY  07/17/2022   Procedure: POLYPECTOMY INTESTINAL;  Surgeon: Evonnie Dorothyann LABOR, DO;  Location: [facility];  Service: General;;   Social History   Tobacco Use   Smoking status: Every Day    Current packs/day: 1.50    Types: Cigarettes    Passive exposure: Never   Smokeless tobacco: Never  Vaping Use   Vaping status: Never Used  Substance Use Topics   Alcohol use: No   Drug use: No   Social History   Social History Narrative   Not  on file     Immunization History  Administered Date(s) Administered   Td 05/19/2014     Objective: Vital Signs: There were no vitals taken for this visit.   Physical Exam Vitals and nursing note reviewed.  Constitutional:      Appearance: He is well-developed.  HENT:     Head: Normocephalic and atraumatic.  Eyes:     Conjunctiva/sclera: Conjunctivae normal.     Pupils: Pupils are equal, round, and reactive to light.  Cardiovascular:     Rate and Rhythm: Normal rate and regular rhythm.     Heart  sounds: Normal heart sounds.  Pulmonary:     Effort: Pulmonary effort is normal.     Breath sounds: Normal breath sounds.  Abdominal:     General: Bowel sounds are normal.     Palpations: Abdomen is soft.  Musculoskeletal:     Cervical back: Normal range of motion and neck supple.  Skin:    General: Skin is warm and dry.     Capillary Refill: Capillary refill takes less than 2 seconds.  Neurological:     Mental Status: He is alert and oriented to person, place, and time.  Psychiatric:        Behavior: Behavior normal.      Musculoskeletal Exam: ***  CDAI Exam: CDAI Score: -- Patient Global: --; Provider Global: -- Swollen: --; Tender: -- Joint Exam 08/03/2024   No joint exam has been documented for this visit   There is currently no information documented on the homunculus. Go to the Rheumatology activity and complete the homunculus joint exam.  Investigation: No additional findings.  Imaging: No results found.  Recent Labs: Lab Results  Component Value Date   WBC 11.0 (H) 06/03/2024   HGB 15.5 06/03/2024   PLT 288 06/03/2024   NA 142 06/03/2024   K 4.5 06/03/2024   CL 105 06/03/2024   CO2 30 06/03/2024   GLUCOSE 96 06/03/2024   BUN 17 06/03/2024   CREATININE 0.87 06/03/2024   BILITOT 0.4 06/03/2024   ALKPHOS 100 12/20/2021   AST 15 06/03/2024   ALT 24 06/03/2024   PROT 7.0 06/03/2024   ALBUMIN 4.2 12/20/2021   CALCIUM 9.5 06/03/2024   GFRAA >60 09/07/2016   QFTBGOLDPLUS NEGATIVE 04/10/2024    Speciality Comments: PLQ Eye Exam: 03/25/2023 WNL @ [facility] Follow up in 1 year   Procedures:  No procedures performed Allergies: Penicillins and Morphine  and codeine   Assessment / Plan:     Visit Diagnoses: Rheumatoid arthritis involving multiple sites with positive rheumatoid factor (HCC)  High risk medication use  Positive anti-CCP test  Primary osteoarthritis of both hands  Chronic pain of right knee  Primary osteoarthritis of both  feet  Degeneration of intervertebral disc of lumbar region without discogenic back pain or lower extremity pain  Family history of rheumatoid arthritis-mother  Family history of colon cancer-father  Onychomycosis  Ureteral calculus  History of gastroesophageal reflux (GERD)  Smoker  Dyslipidemia  Orders: No orders of the defined types were placed in this encounter.  No orders of the defined types were placed in this encounter.   Face-to-face time spent with patient was *** minutes. Greater than 50% of time was spent in counseling and coordination of care.  Follow-Up Instructions: No follow-ups on file.   Waddell CHRISTELLA Craze, PA-C  Note - This record has been created using Dragon software.  Chart creation errors have been sought, but may not always  have been  located. Such creation errors do not reflect on  the standard of medical care.

## 2024-07-23 ENCOUNTER — Encounter: Payer: Self-pay | Admitting: Dermatology

## 2024-07-23 ENCOUNTER — Ambulatory Visit (INDEPENDENT_AMBULATORY_CARE_PROVIDER_SITE_OTHER): Admitting: Dermatology

## 2024-07-23 DIAGNOSIS — L905 Scar conditions and fibrosis of skin: Secondary | ICD-10-CM | POA: Diagnosis not present

## 2024-07-23 DIAGNOSIS — Z85828 Personal history of other malignant neoplasm of skin: Secondary | ICD-10-CM | POA: Diagnosis not present

## 2024-07-23 DIAGNOSIS — L539 Erythematous condition, unspecified: Secondary | ICD-10-CM

## 2024-07-23 DIAGNOSIS — C4491 Basal cell carcinoma of skin, unspecified: Secondary | ICD-10-CM

## 2024-07-23 NOTE — Patient Instructions (Signed)

## 2024-07-23 NOTE — Progress Notes (Signed)
    Follow Up Visit   Subjective  KEVRON PATELLA is a 48 y.o. male who presents for the following: follow up from Mohs surgery   The patient presents for follow up from Mohs surgery for a [facility] on the right [place], treated on 06/25/2024, repaired with Complex closure. The patient has been bandaging the wound as directed. The endorse the following concerns: None  The following portions of the chart were reviewed this encounter and updated as appropriate: medications, allergies, medical history  Review of Systems:  No other skin or systemic complaints except as noted in HPI or Assessment and Plan.  Objective  Well appearing patient in no apparent distress; mood and affect are within normal limits.  A focal examination was performed including scalp, head, face and right Temple All findings within normal limits unless otherwise noted below.  Healing wound with mild erythema  Relevant physical exam findings are noted in the Assessment and Plan.      Assessment & Plan   Healing Wound s/p Mohs for [facility], treated on Right Temple on 06/25/2024, repaired with Complex Closure - Reassured that wound is healing well - No evidence of infection - No swelling, induration, purulence, dehiscence, or tenderness out of proportion to the clinical exam, see photo above - Discussed that scars take up to 12 months to mature from the date of surgery - Recommend SPF 30+ to scar daily to prevent purple color from UV exposure during scar maturation process - Discussed that erythema and raised appearance of scar will fade over the next 4-6 months - OK to start scar massage at 4-6 weeks post-op - Can consider silicone based products for scar healing starting at 6 weeks post-op  HISTORY OF BASAL CELL CARCINOMA OF THE SKIN - No evidence of recurrence today - Recommend regular full body skin exams - Recommend daily broad spectrum sunscreen SPF 30+ to sun-exposed areas, reapply every 2 hours as needed.  - Call  if any new or changing lesions are noted between office visits   Return in about 6 months (around 01/20/2025) for TBSE (With Erminio or Traeson Dusza).  I, Jetta Ager, am acting as scribe for RUFUS CHRISTELLA HOLY, MD.  Documentation: I have reviewed the above documentation for accuracy and completeness, and I agree with the above.  RUFUS CHRISTELLA HOLY, MD

## 2024-08-01 ENCOUNTER — Other Ambulatory Visit: Payer: Self-pay | Admitting: Physician Assistant

## 2024-08-03 ENCOUNTER — Ambulatory Visit: Admitting: Physician Assistant

## 2024-08-03 ENCOUNTER — Other Ambulatory Visit: Payer: Self-pay

## 2024-08-03 DIAGNOSIS — B351 Tinea unguium: Secondary | ICD-10-CM

## 2024-08-03 DIAGNOSIS — G8929 Other chronic pain: Secondary | ICD-10-CM

## 2024-08-03 DIAGNOSIS — R7681 Abnormal rheumatoid factor and anti-citrullinated protein antibody without rheumatoid arthritis: Secondary | ICD-10-CM

## 2024-08-03 DIAGNOSIS — M0579 Rheumatoid arthritis with rheumatoid factor of multiple sites without organ or systems involvement: Secondary | ICD-10-CM

## 2024-08-03 DIAGNOSIS — N201 Calculus of ureter: Secondary | ICD-10-CM

## 2024-08-03 DIAGNOSIS — Z79899 Other long term (current) drug therapy: Secondary | ICD-10-CM

## 2024-08-03 DIAGNOSIS — Z800 Family history of malignant neoplasm of digestive organs: Secondary | ICD-10-CM

## 2024-08-03 DIAGNOSIS — M19041 Primary osteoarthritis, right hand: Secondary | ICD-10-CM

## 2024-08-03 DIAGNOSIS — E785 Hyperlipidemia, unspecified: Secondary | ICD-10-CM

## 2024-08-03 DIAGNOSIS — M51369 Other intervertebral disc degeneration, lumbar region without mention of lumbar back pain or lower extremity pain: Secondary | ICD-10-CM

## 2024-08-03 DIAGNOSIS — Z8719 Personal history of other diseases of the digestive system: Secondary | ICD-10-CM

## 2024-08-03 DIAGNOSIS — M19071 Primary osteoarthritis, right ankle and foot: Secondary | ICD-10-CM

## 2024-08-03 DIAGNOSIS — Z8261 Family history of arthritis: Secondary | ICD-10-CM

## 2024-08-03 DIAGNOSIS — F17200 Nicotine dependence, unspecified, uncomplicated: Secondary | ICD-10-CM

## 2024-08-03 MED ORDER — METHOTREXATE SODIUM CHEMO INJECTION 50 MG/2ML: 20.0000 mg | INTRAMUSCULAR | 0 refills | Status: DC

## 2024-08-03 NOTE — Telephone Encounter (Signed)
 Last Fill: 03/18/2024  Eye exam: 03/25/2023    Labs: 06/03/2024 WBC count is slightly elevated-absolute neutrophils and monocytes are elevated-likely related to being on prednisone . Rest of CBC WNL.   CMP WNL Hgb A1c is 5.5%.    Next Visit: 08/03/2024  Last Visit: 06/03/2024  IK:Myzlfjunpi arthritis involving multiple sites with positive rheumatoid factor   Current Dose per office note on 06/03/2024: Plaquenil  200 mg 1 tablet by mouth twice daily.   Attempted to contact patient and left message to advise we need updated PLQ eye exam report.   Okay to refill 30 day supply of Plaquenil ?

## 2024-08-03 NOTE — Telephone Encounter (Signed)
 Patient contacted the office and requested a refill of Methotrexate  be sent to [facility] on [address].   Last Fill: 05/08/2024  Labs: 06/03/2024 WBC count is slightly elevated-absolute neutrophils and monocytes are elevated-likely related to being on prednisone . Rest of CBC WNL.   CMP WNL Hgb A1c is 5.5%.    Next Visit: 10/05/2024  Last Visit: 06/03/2024  DX: Rheumatoid arthritis involving multiple sites with positive rheumatoid factor (HCC)   Current Dose per office note 06/03/2024: methotrexate  0.8 ml sq injections once weekly   Okay to refill Methotrexate ?

## 2024-08-31 ENCOUNTER — Telehealth: Payer: Self-pay

## 2024-08-31 NOTE — Telephone Encounter (Signed)
 Patient called the office asking for a refill on his methotrexate , advised patient that a 90 day supply was sent in on 08/03/2024 to [facility], he stated he had a insurance issue and was only given a 35 day supply. Contacted pharmacy to see where the rest of the medication was since patient was only given a 30 day supply, pharmacy states they had the remainder of it an patient could come pick it up.

## 2024-09-21 NOTE — Progress Notes (Unsigned)
 "  Office Visit Note  Patient: Jeffery Mckee             Date of Birth: Sep 11, 1975           MRN: [id number]             PCP: Vida Mardy DEL, PA-C Referring: Vida Mardy DEL, PA-C Visit Date: 10/05/2024 Occupation: Data Unavailable  Subjective:  No chief complaint on file.   History of Present Illness: Jeffery Mckee is a 49 y.o. male ***     Activities of Daily Living:  Patient reports morning stiffness for *** {minute/hour:19697}.   Patient {ACTIONS;DENIES/REPORTS:21021675::Denies} nocturnal pain.  Difficulty dressing/grooming: {ACTIONS;DENIES/REPORTS:21021675::Denies} Difficulty climbing stairs: {ACTIONS;DENIES/REPORTS:21021675::Denies} Difficulty getting out of chair: {ACTIONS;DENIES/REPORTS:21021675::Denies} Difficulty using hands for taps, buttons, cutlery, and/or writing: {ACTIONS;DENIES/REPORTS:21021675::Denies}  No Rheumatology ROS completed.   PMFS History:  Patient Active Problem List   Diagnosis Date Noted   BCC (basal cell carcinoma of skin) 05/25/2024   Rheumatoid arthritis involving multiple sites with positive rheumatoid factor (HCC) 01/14/2024   Primary osteoarthritis of both hands 01/14/2024   Primary osteoarthritis of both feet 01/14/2024   Degeneration of intervertebral disc of lumbar region without discogenic back pain or lower extremity pain 01/14/2024   Colon cancer screening 07/17/2022   Hemorrhoids without complication 07/17/2022   Benign neoplasm of rectum and anal canal 07/17/2022   Benign neoplasm of rectosigmoid junction 07/17/2022   Family history of colon cancer 07/17/2022   Ureteral calculus 12/29/2021   Bilateral renal cysts 12/29/2021    Past Medical History:  Diagnosis Date   Arthritis    BCC (basal cell carcinoma of skin) 05/25/2024   right temple -  Mohs needed    GERD (gastroesophageal reflux disease)    Nephrolithiasis    Obesity    RA (rheumatoid arthritis) (HCC)     Family History  Problem Relation Age  of Onset   Hypertension Mother    Osteoarthritis Mother    Kidney Stones Mother    Diabetes Mother    Gout Mother    Neuropathy Mother    Cancer Father    Hypertension Sister    Diabetes Sister    Past Surgical History:  Procedure Laterality Date   CHOLECYSTECTOMY     COLONOSCOPY WITH PROPOFOL  N/A 07/17/2022   Procedure: COLONOSCOPY WITH PROPOFOL ;  Surgeon: Evonnie Dorothyann LABOR, DO;  [facility]: [facility];  Service: General;  Laterality: N/A;   HERNIA REPAIR     MOLE REMOVAL     x5   POLYPECTOMY  07/17/2022   Procedure: POLYPECTOMY INTESTINAL;  Surgeon: Evonnie Dorothyann LABOR, DO;  Location: [facility];  Service: General;;   Social History[1] Social History   Social History Narrative   Not on file     Immunization History  Administered Date(s) Administered   Td 05/19/2014     Objective: Vital Signs: There were no vitals taken for this visit.   Physical Exam   Musculoskeletal Exam: ***  CDAI Exam: CDAI Score: -- Patient Global: --; Provider Global: -- Swollen: --; Tender: -- Joint Exam 10/05/2024   No joint exam has been documented for this visit   There is currently no information documented on the homunculus. Go to the Rheumatology activity and complete the homunculus joint exam.  Investigation: No additional findings.  Imaging: No results found.  Recent Labs: Lab Results  Component Value Date   WBC 11.0 (H) 06/03/2024   HGB 15.5 06/03/2024   PLT 288 06/03/2024   NA 142 06/03/2024  K 4.5 06/03/2024   CL 105 06/03/2024   CO2 30 06/03/2024   GLUCOSE 96 06/03/2024   BUN 17 06/03/2024   CREATININE 0.87 06/03/2024   BILITOT 0.4 06/03/2024   ALKPHOS 100 12/20/2021   AST 15 06/03/2024   ALT 24 06/03/2024   PROT 7.0 06/03/2024   ALBUMIN 4.2 12/20/2021   CALCIUM 9.5 06/03/2024   GFRAA >60 09/07/2016   QFTBGOLDPLUS NEGATIVE 04/10/2024    Speciality Comments: PLQ Eye Exam: 03/25/2023 WNL @ [facility] Follow up in 1 year    Procedures:  No procedures performed Allergies: Penicillins and Morphine  and codeine   Assessment / Plan:     Visit Diagnoses: No diagnosis found.  Orders: No orders of the defined types were placed in this encounter.  No orders of the defined types were placed in this encounter.   Face-to-face time spent with patient was *** minutes. Greater than 50% of time was spent in counseling and coordination of care.  Follow-Up Instructions: No follow-ups on file.   Daved JAYSON Gavel, CMA  Note - This record has been created using Animal nutritionist.  Chart creation errors have been sought, but may not always  have been located. Such creation errors do not reflect on  the standard of medical care.    [1]  Social History Tobacco Use   Smoking status: Every Day    Current packs/day: 1.50    Types: Cigarettes    Passive exposure: Never   Smokeless tobacco: Never  Vaping Use   Vaping status: Never Used  Substance Use Topics   Alcohol use: No   Drug use: No   "

## 2024-09-30 ENCOUNTER — Other Ambulatory Visit: Payer: Self-pay

## 2024-09-30 ENCOUNTER — Other Ambulatory Visit: Payer: Self-pay | Admitting: Physician Assistant

## 2024-09-30 MED ORDER — HYDROXYCHLOROQUINE SULFATE 200 MG PO TABS: 200.0000 mg | ORAL_TABLET | Freq: Two times a day (BID) | ORAL | 0 refills | Status: AC

## 2024-09-30 NOTE — Telephone Encounter (Signed)
 Patient called to request a refill of PLQ.   Last Fill: 08/03/2024 (30 day supply)  Eye exam: 03/25/2023   Labs: 06/03/2024 WBC count is slightly elevated-absolute neutrophils and monocytes are elevated-likely related to being on prednisone . Rest of CBC WNL.   CMP WNL Hgb A1c is 5.5%.    Next Visit: 10/05/2024  Last Visit: 06/03/2024  IK:Myzlfjunpi arthritis involving multiple sites with positive rheumatoid factor   Current Dose per office note on 06/03/2024: Plaquenil  200 mg 1 tablet by mouth twice daily.    Patient states he did update his plaquenil  eye exam in 2025 and will call [facility] to have the report faxed to our office.   Okay to refill Plaquenil ?

## 2024-10-05 ENCOUNTER — Ambulatory Visit: Admitting: Physician Assistant

## 2024-10-05 DIAGNOSIS — Z79899 Other long term (current) drug therapy: Secondary | ICD-10-CM

## 2024-10-05 DIAGNOSIS — M0579 Rheumatoid arthritis with rheumatoid factor of multiple sites without organ or systems involvement: Secondary | ICD-10-CM

## 2024-10-05 DIAGNOSIS — F17200 Nicotine dependence, unspecified, uncomplicated: Secondary | ICD-10-CM

## 2024-10-05 DIAGNOSIS — B351 Tinea unguium: Secondary | ICD-10-CM

## 2024-10-05 DIAGNOSIS — Z7952 Long term (current) use of systemic steroids: Secondary | ICD-10-CM

## 2024-10-05 DIAGNOSIS — G8929 Other chronic pain: Secondary | ICD-10-CM

## 2024-10-05 DIAGNOSIS — M51369 Other intervertebral disc degeneration, lumbar region without mention of lumbar back pain or lower extremity pain: Secondary | ICD-10-CM

## 2024-10-05 DIAGNOSIS — Z800 Family history of malignant neoplasm of digestive organs: Secondary | ICD-10-CM

## 2024-10-05 DIAGNOSIS — E785 Hyperlipidemia, unspecified: Secondary | ICD-10-CM

## 2024-10-05 DIAGNOSIS — R7681 Abnormal rheumatoid factor and anti-citrullinated protein antibody without rheumatoid arthritis: Secondary | ICD-10-CM

## 2024-10-05 DIAGNOSIS — N201 Calculus of ureter: Secondary | ICD-10-CM

## 2024-10-05 DIAGNOSIS — M19041 Primary osteoarthritis, right hand: Secondary | ICD-10-CM

## 2024-10-05 DIAGNOSIS — Z8719 Personal history of other diseases of the digestive system: Secondary | ICD-10-CM

## 2024-10-05 DIAGNOSIS — M19071 Primary osteoarthritis, right ankle and foot: Secondary | ICD-10-CM

## 2024-10-05 DIAGNOSIS — Z8261 Family history of arthritis: Secondary | ICD-10-CM

## 2024-10-07 ENCOUNTER — Encounter: Payer: Self-pay | Admitting: Physician Assistant

## 2024-10-07 ENCOUNTER — Ambulatory Visit: Admitting: Physician Assistant

## 2024-10-07 VITALS — BP 114/71 | HR 71 | Temp 98.1°F | Resp 17 | Ht 65.0 in | Wt 247.8 lb

## 2024-10-07 DIAGNOSIS — M51369 Other intervertebral disc degeneration, lumbar region without mention of lumbar back pain or lower extremity pain: Secondary | ICD-10-CM | POA: Diagnosis not present

## 2024-10-07 DIAGNOSIS — B351 Tinea unguium: Secondary | ICD-10-CM | POA: Diagnosis not present

## 2024-10-07 DIAGNOSIS — M0579 Rheumatoid arthritis with rheumatoid factor of multiple sites without organ or systems involvement: Secondary | ICD-10-CM | POA: Diagnosis not present

## 2024-10-07 DIAGNOSIS — E785 Hyperlipidemia, unspecified: Secondary | ICD-10-CM

## 2024-10-07 DIAGNOSIS — Z8261 Family history of arthritis: Secondary | ICD-10-CM | POA: Diagnosis not present

## 2024-10-07 DIAGNOSIS — Z800 Family history of malignant neoplasm of digestive organs: Secondary | ICD-10-CM

## 2024-10-07 DIAGNOSIS — Z8719 Personal history of other diseases of the digestive system: Secondary | ICD-10-CM | POA: Diagnosis not present

## 2024-10-07 DIAGNOSIS — G8929 Other chronic pain: Secondary | ICD-10-CM

## 2024-10-07 DIAGNOSIS — M19041 Primary osteoarthritis, right hand: Secondary | ICD-10-CM

## 2024-10-07 DIAGNOSIS — F17200 Nicotine dependence, unspecified, uncomplicated: Secondary | ICD-10-CM | POA: Diagnosis not present

## 2024-10-07 DIAGNOSIS — M19071 Primary osteoarthritis, right ankle and foot: Secondary | ICD-10-CM

## 2024-10-07 DIAGNOSIS — M25561 Pain in right knee: Secondary | ICD-10-CM | POA: Diagnosis not present

## 2024-10-07 DIAGNOSIS — Z79899 Other long term (current) drug therapy: Secondary | ICD-10-CM | POA: Diagnosis not present

## 2024-10-07 DIAGNOSIS — R7681 Abnormal rheumatoid factor and anti-citrullinated protein antibody without rheumatoid arthritis: Secondary | ICD-10-CM

## 2024-10-07 DIAGNOSIS — N201 Calculus of ureter: Secondary | ICD-10-CM | POA: Diagnosis not present

## 2024-10-07 DIAGNOSIS — M19072 Primary osteoarthritis, left ankle and foot: Secondary | ICD-10-CM

## 2024-10-07 DIAGNOSIS — M19042 Primary osteoarthritis, left hand: Secondary | ICD-10-CM

## 2024-10-07 LAB — CBC WITH DIFFERENTIAL/PLATELET
Absolute Lymphocytes: 1132 {cells}/uL (ref 850–3900)
Absolute Monocytes: 888 {cells}/uL (ref 200–950)
Basophils Absolute: 30 {cells}/uL (ref 0–200)
Basophils Relative: 0.4 %
Eosinophils Absolute: 192 {cells}/uL (ref 15–500)
Eosinophils Relative: 2.6 %
HCT: 45.5 % (ref 39.4–51.1)
Hemoglobin: 15.6 g/dL (ref 13.2–17.1)
MCH: 33.2 pg — ABNORMAL HIGH (ref 27.0–33.0)
MCHC: 34.3 g/dL (ref 31.6–35.4)
MCV: 96.8 fL (ref 81.4–101.7)
MPV: 9.7 fL (ref 7.5–12.5)
Monocytes Relative: 12 %
Neutro Abs: 5158 {cells}/uL (ref 1500–7800)
Neutrophils Relative %: 69.7 %
Platelets: 327 10*3/uL (ref 140–400)
RBC: 4.7 Million/uL (ref 4.20–5.80)
RDW: 12.7 % (ref 11.0–15.0)
Total Lymphocyte: 15.3 %
WBC: 7.4 10*3/uL (ref 3.8–10.8)

## 2024-10-07 LAB — COMPREHENSIVE METABOLIC PANEL WITH GFR
AG Ratio: 1.6 (calc) (ref 1.0–2.5)
ALT: 18 U/L (ref 9–46)
AST: 16 U/L (ref 10–40)
Albumin: 4.5 g/dL (ref 3.6–5.1)
Alkaline phosphatase (APISO): 107 U/L (ref 36–130)
BUN: 18 mg/dL (ref 7–25)
CO2: 27 mmol/L (ref 20–32)
Calcium: 9.2 mg/dL (ref 8.6–10.3)
Chloride: 104 mmol/L (ref 98–110)
Creat: 0.63 mg/dL (ref 0.60–1.29)
Globulin: 2.9 g/dL (ref 1.9–3.7)
Glucose, Bld: 84 mg/dL (ref 65–99)
Potassium: 4.2 mmol/L (ref 3.5–5.3)
Sodium: 138 mmol/L (ref 135–146)
Total Bilirubin: 0.4 mg/dL (ref 0.2–1.2)
Total Protein: 7.4 g/dL (ref 6.1–8.1)
eGFR: 118 mL/min/{1.73_m2}

## 2024-10-07 MED ORDER — METHOTREXATE SODIUM CHEMO INJECTION 50 MG/2ML: 20.0000 mg | INTRAMUSCULAR | 0 refills | Status: AC

## 2024-10-08 ENCOUNTER — Ambulatory Visit: Payer: Self-pay | Admitting: Physician Assistant

## 2024-10-08 NOTE — Progress Notes (Signed)
 CBC and CMP WNL

## 2024-10-19 ENCOUNTER — Ambulatory Visit: Admitting: Dermatology

## 2025-01-26 ENCOUNTER — Ambulatory Visit: Admitting: Dermatology

## 2025-03-09 ENCOUNTER — Ambulatory Visit: Admitting: Rheumatology
